# Patient Record
Sex: Female | Born: 2000 | Race: White | Hispanic: No | Marital: Single | Smoking: Former smoker
Health system: Southern US, Community
[De-identification: ages and names within clinical notes are randomized; demographics above are authoritative.]

## PROBLEM LIST (undated history)

## (undated) DIAGNOSIS — R011 Cardiac murmur, unspecified: Secondary | ICD-10-CM

## (undated) DIAGNOSIS — R569 Unspecified convulsions: Secondary | ICD-10-CM

## (undated) HISTORY — DX: Unspecified convulsions: R56.9

## (undated) HISTORY — PX: NO PAST SURGERIES: SHX2092

## (undated) HISTORY — DX: Cardiac murmur, unspecified: R01.1

---
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
## 2020-01-23 ENCOUNTER — Telehealth: Payer: Self-pay | Admitting: Adult Health

## 2020-01-23 NOTE — Telephone Encounter (Signed)
Unable to reach patient to review covid restrictions will screen patient upon check in at upcoming appointment on 01/24/20

## 2020-01-24 ENCOUNTER — Ambulatory Visit: Payer: Medicaid Other | Admitting: Adult Health

## 2020-02-22 ENCOUNTER — Ambulatory Visit: Payer: Medicaid Other | Admitting: Adult Health

## 2020-04-16 ENCOUNTER — Other Ambulatory Visit: Payer: Self-pay

## 2020-04-16 DIAGNOSIS — Z3A11 11 weeks gestation of pregnancy: Secondary | ICD-10-CM | POA: Insufficient documentation

## 2020-04-16 DIAGNOSIS — R109 Unspecified abdominal pain: Secondary | ICD-10-CM | POA: Diagnosis not present

## 2020-04-16 DIAGNOSIS — Z5321 Procedure and treatment not carried out due to patient leaving prior to being seen by health care provider: Secondary | ICD-10-CM | POA: Diagnosis not present

## 2020-04-16 DIAGNOSIS — O26891 Other specified pregnancy related conditions, first trimester: Secondary | ICD-10-CM | POA: Insufficient documentation

## 2020-04-17 ENCOUNTER — Emergency Department (HOSPITAL_COMMUNITY)
Admission: EM | Admit: 2020-04-17 | Discharge: 2020-04-17 | Disposition: A | Discharge status: Home / Self Care | Payer: Medicaid Other | Attending: Emergency Medicine | Admitting: Emergency Medicine

## 2020-04-17 ENCOUNTER — Encounter (HOSPITAL_COMMUNITY): Payer: Self-pay | Admitting: Emergency Medicine

## 2020-04-17 ENCOUNTER — Other Ambulatory Visit: Payer: Self-pay

## 2020-04-17 NOTE — ED Triage Notes (Signed)
Patient states she is [redacted] weeks pregnant and was being treated for an infection. Patient states that she was placed on a medicated gel/cream Metronidazole. Patient stated the medication yesterday. Patient states that she has been having abdominal cramping and passing possible white large pieces of discharge. Patient denies any bleeding.

## 2020-05-18 ENCOUNTER — Encounter (HOSPITAL_COMMUNITY): Payer: Self-pay

## 2020-05-18 ENCOUNTER — Emergency Department (HOSPITAL_COMMUNITY)
Admission: EM | Admit: 2020-05-18 | Discharge: 2020-05-18 | Disposition: A | Discharge status: Home / Self Care | Payer: Medicaid Other | Attending: Emergency Medicine | Admitting: Emergency Medicine

## 2020-05-18 ENCOUNTER — Other Ambulatory Visit: Payer: Self-pay

## 2020-05-18 DIAGNOSIS — O26892 Other specified pregnancy related conditions, second trimester: Secondary | ICD-10-CM | POA: Insufficient documentation

## 2020-05-18 DIAGNOSIS — Z5321 Procedure and treatment not carried out due to patient leaving prior to being seen by health care provider: Secondary | ICD-10-CM | POA: Diagnosis not present

## 2020-05-18 DIAGNOSIS — R21 Rash and other nonspecific skin eruption: Secondary | ICD-10-CM | POA: Diagnosis not present

## 2020-05-18 NOTE — ED Triage Notes (Signed)
Pt reports rash to inner thighs, denies pain or itching, says it has been there for about a week, pt recently diagnosed with BV and currently taking medication prescribed for that.

## 2020-06-04 ENCOUNTER — Other Ambulatory Visit: Payer: Self-pay

## 2020-06-04 ENCOUNTER — Emergency Department (HOSPITAL_COMMUNITY)
Admission: EM | Admit: 2020-06-04 | Discharge: 2020-06-04 | Disposition: A | Discharge status: Home / Self Care | Payer: Medicaid Other | Attending: Emergency Medicine | Admitting: Emergency Medicine

## 2020-06-04 ENCOUNTER — Encounter (HOSPITAL_COMMUNITY): Payer: Self-pay

## 2020-06-04 DIAGNOSIS — O219 Vomiting of pregnancy, unspecified: Secondary | ICD-10-CM | POA: Diagnosis not present

## 2020-06-04 DIAGNOSIS — Z5321 Procedure and treatment not carried out due to patient leaving prior to being seen by health care provider: Secondary | ICD-10-CM | POA: Insufficient documentation

## 2020-06-04 DIAGNOSIS — Z3A18 18 weeks gestation of pregnancy: Secondary | ICD-10-CM | POA: Insufficient documentation

## 2020-06-04 DIAGNOSIS — H9201 Otalgia, right ear: Secondary | ICD-10-CM | POA: Diagnosis present

## 2020-06-04 LAB — CBC
HCT: 36.8 % (ref 36.0–46.0)
Hemoglobin: 12.3 g/dL (ref 12.0–15.0)
MCH: 28.6 pg (ref 26.0–34.0)
MCHC: 33.4 g/dL (ref 30.0–36.0)
MCV: 85.6 fL (ref 80.0–100.0)
Platelets: 194 10*3/uL (ref 150–400)
RBC: 4.3 MIL/uL (ref 3.87–5.11)
RDW: 13.4 % (ref 11.5–15.5)
WBC: 7.7 10*3/uL (ref 4.0–10.5)
nRBC: 0 % (ref 0.0–0.2)

## 2020-06-04 LAB — COMPREHENSIVE METABOLIC PANEL
ALT: 11 U/L (ref 0–44)
AST: 11 U/L — ABNORMAL LOW (ref 15–41)
Albumin: 3.4 g/dL — ABNORMAL LOW (ref 3.5–5.0)
Alkaline Phosphatase: 39 U/L (ref 38–126)
Anion gap: 10 (ref 5–15)
BUN: 8 mg/dL (ref 6–20)
CO2: 20 mmol/L — ABNORMAL LOW (ref 22–32)
Calcium: 8.6 mg/dL — ABNORMAL LOW (ref 8.9–10.3)
Chloride: 104 mmol/L (ref 98–111)
Creatinine, Ser: 0.38 mg/dL — ABNORMAL LOW (ref 0.44–1.00)
GFR, Estimated: >60 mL/min (ref >60)
Glucose, Bld: 98 mg/dL (ref 70–99)
Potassium: 3.5 mmol/L (ref 3.5–5.1)
Sodium: 134 mmol/L — ABNORMAL LOW (ref 135–145)
Total Bilirubin: 1.1 mg/dL (ref 0.3–1.2)
Total Protein: 6.5 g/dL (ref 6.5–8.1)

## 2020-06-04 LAB — LIPASE, BLOOD: Lipase: 21 U/L (ref 11–51)

## 2020-06-04 NOTE — ED Triage Notes (Signed)
Pt presents to ED with complaints of right ear ache. Pt also states she is [redacted] weeks pregnant, [place] and had vomiting yesterday until this morning.

## 2020-08-21 ENCOUNTER — Emergency Department (HOSPITAL_COMMUNITY)
Admission: EM | Admit: 2020-08-21 | Discharge: 2020-08-21 | Discharge status: Against Medical Advice | Payer: Medicaid Other

## 2020-08-21 ENCOUNTER — Other Ambulatory Visit: Payer: Self-pay

## 2020-08-21 NOTE — ED Triage Notes (Signed)
Left prior to triage 

## 2020-09-29 ENCOUNTER — Emergency Department (HOSPITAL_COMMUNITY)
Admission: EM | Admit: 2020-09-29 | Discharge: 2020-09-30 | Disposition: A | Discharge status: Home / Self Care | Payer: Medicaid Other | Attending: Emergency Medicine | Admitting: Emergency Medicine

## 2020-09-29 ENCOUNTER — Encounter (HOSPITAL_COMMUNITY): Payer: Self-pay | Admitting: Emergency Medicine

## 2020-09-29 ENCOUNTER — Other Ambulatory Visit: Payer: Self-pay

## 2020-09-29 DIAGNOSIS — Z3A35 35 weeks gestation of pregnancy: Secondary | ICD-10-CM | POA: Insufficient documentation

## 2020-09-29 DIAGNOSIS — N939 Abnormal uterine and vaginal bleeding, unspecified: Secondary | ICD-10-CM

## 2020-09-29 DIAGNOSIS — Z87891 Personal history of nicotine dependence: Secondary | ICD-10-CM | POA: Diagnosis not present

## 2020-09-29 DIAGNOSIS — O26853 Spotting complicating pregnancy, third trimester: Secondary | ICD-10-CM | POA: Insufficient documentation

## 2020-09-29 LAB — WET PREP, GENITAL
Clue Cells Wet Prep HPF POC: NONE SEEN
Sperm: NONE SEEN
Trich, Wet Prep: NONE SEEN
Yeast Wet Prep HPF POC: NONE SEEN

## 2020-09-29 NOTE — ED Provider Notes (Signed)
[name] EMERGENCY DEPARTMENT Provider Note   CSN: [id number] Arrival date & time: 09/29/20  2154     History Chief Complaint  Patient presents with  . Hematuria    Ana Gordon is a 20 y.o. female.  Patient G2, P1 at [redacted] weeks gestation here with vaginal bleeding versus hematuria.  States earlier this afternoon she saw some bright red blood after wiping after urinating.  She is not certain if this was from her urine or from her vagina.  She states the urine itself is was clear.  She denies any gush of fluid or for further vaginal bleeding.  She denies any abdominal pain or contractions.  No back pain.  No cough, fever.  States no problems with this pregnancy.  She has no abdominal pain or contractions.  She is not had bleeding with this pregnancy before.  Denies any flank pain. Denies any recent sexual activity.  The history is provided by the patient.  Hematuria Pertinent negatives include no chest pain, no abdominal pain, no headaches and no shortness of breath.       Past Medical History:  Diagnosis Date  . Heart murmur     There are no problems to display for this patient.   Past Surgical History:  Procedure Laterality Date  . NO PAST SURGERIES       OB History    Gravida  2   Para  1   Term  1   Preterm      AB      Living  1     SAB      IAB      Ectopic      Multiple  0   Live Births  1           Family History  Problem Relation Age of Onset  . Diabetes Maternal Grandmother   . Hypertension Maternal Grandmother   . Alzheimer's disease Maternal Grandfather   . Hypertension Maternal Aunt   . Hypertension Maternal Uncle   . Hypertension Cousin     Social History   Tobacco Use  . Smoking status: Former Smoker    Types: Cigarettes, Cigars    Quit date: 05/18/2018    Years since quitting: 2.3  . Smokeless tobacco: Never Used  Vaping Use  . Vaping Use: Never used  Substance Use Topics  . Alcohol use: Not Currently     Comment: occ  . Drug use: Not Currently    Types: Marijuana    Comment: before pregnancy    Home Medications Prior to Admission medications   Medication Sig Start Date End Date Taking? Authorizing Provider  docusate sodium (COLACE) 100 MG capsule Take 1 capsule (100 mg total) by mouth 2 (two) times daily. Patient not taking: Reported on 03/01/2019 01/24/19   Arvilla Market, DO  LO LOESTRIN FE 1 MG-10 MCG / 10 MCG tablet Take 1 tablet by mouth daily. Patient not taking: Reported on 12/06/2019 03/01/19   Cheral Marker, CNM  Prenatal Vit-Fe Fumarate-FA (PREPLUS) 27-1 MG TABS TALE 1 TABLET DAILY AT 12 NOON. Patient not taking: Reported on 03/01/2019 10/21/18   Adline Potter, NP    Allergies    Bactrim [sulfamethoxazole-trimethoprim]  Review of Systems   Review of Systems  Constitutional: Negative for activity change, appetite change and fever.  HENT: Negative for congestion and rhinorrhea.   Respiratory: Negative for cough, chest tightness and shortness of breath.   Cardiovascular: Negative for chest pain and leg  swelling.  Gastrointestinal: Negative for abdominal pain, nausea and vomiting.  Genitourinary: Positive for hematuria and vaginal bleeding. Negative for dyspareunia.  Musculoskeletal: Negative for arthralgias and myalgias.  Neurological: Negative for dizziness, weakness and headaches.   all other systems are negative except as noted in the HPI and PMH.   Physical Exam Updated Vital Signs BP 127/70   Pulse 82   Temp 98.1 F (36.7 C)   Resp 18   Ht 5\' 4"  (1.626 m)   Wt 74.4 kg   LMP 01/18/2020 (Approximate)   SpO2 100%   BMI 28.15 kg/m   Physical Exam Vitals and nursing note reviewed.  Constitutional:      General: She is not in acute distress.    Appearance: She is well-developed and well-nourished.  HENT:     Head: Normocephalic and atraumatic.     Mouth/Throat:     Mouth: Oropharynx is clear and moist.     Pharynx: No oropharyngeal exudate.   Eyes:     Extraocular Movements: EOM normal.     Conjunctiva/sclera: Conjunctivae normal.     Pupils: Pupils are equal, round, and reactive to light.  Neck:     Comments: No meningismus. Cardiovascular:     Rate and Rhythm: Normal rate and regular rhythm.     Pulses: Intact distal pulses.     Heart sounds: Normal heart sounds. No murmur heard.   Pulmonary:     Effort: Pulmonary effort is normal. No respiratory distress.     Breath sounds: Normal breath sounds.  Abdominal:     Palpations: Abdomen is soft.     Tenderness: There is no abdominal tenderness. There is no guarding or rebound.     Comments: Gravid abdomen, nontender  Genitourinary:    Comments: Chaperone present.  Normal external genitalia.  No evidence of gross vaginal bleeding.  No blood apparent in vaginal vault on speculum exam.  Cervix is closed. Musculoskeletal:        General: No tenderness or edema. Normal range of motion.     Cervical back: Normal range of motion and neck supple.  Skin:    General: Skin is warm.  Neurological:     Mental Status: She is alert and oriented to person, place, and time.     Cranial Nerves: No cranial nerve deficit.     Motor: No abnormal muscle tone.     Coordination: Coordination normal.     Comments:  5/5 strength throughout. CN 2-12 intact.Equal grip strength.   Psychiatric:        Mood and Affect: Mood and affect normal.        Behavior: Behavior normal.     ED Results / Procedures / Treatments   Labs (all labs ordered are listed, but only abnormal results are displayed) Labs Reviewed  WET PREP, GENITAL - Abnormal; Notable for the following components:      Result Value   WBC, Wet Prep HPF POC FEW (*)    All other components within normal limits  URINALYSIS, ROUTINE W REFLEX MICROSCOPIC - Abnormal; Notable for the following components:   APPearance HAZY (*)    Leukocytes,Ua TRACE (*)    Bacteria, UA RARE (*)    All other components within normal limits  URINE  CULTURE  GC/CHLAMYDIA PROBE AMP ([place]) NOT AT [facility]    EKG None  Radiology No results found.  Procedures Procedures   Medications Ordered in ED Medications - No data to display  ED Course  I have  reviewed the triage vital signs and the nursing notes.  Pertinent labs & imaging results that were available during my care of the patient were reviewed by me and considered in my medical decision making (see chart for details).    MDM Rules/Calculators/A&P                         G2, P1 at [redacted] weeks gestation here with vaginal bleeding after urination.  Denies any pain or gush of fluid.  No evidence of recent vaginal bleeding on exam.  Discussed with Dr. Donavan Foil of obstetrics.  He feels this could be spotting or pink discharge and would not recommend any further evaluation tonight.  Agrees with fetal monitoring.  UA is negative for blood.  Does have white blood cells.  Will send culture.  Patient monitored on tocometry.  She has been cleared by obstetrics. D/w Dr. Donzetta Matters covering for Dr. Ralph Dowdy.  She agrees patient stable for discharge.  Urine culture is pending.  Unclear etiology of vaginal spotting but no evidence of blood in the urine and no evidence of blood in her vaginal vault today.  No evidence of contractions on monitoring.  Patient to follow-up with her obstetric team on Monday.  Return to the ED (at [facility]) with worsening symptoms including abdominal pain, vaginal bleeding, contractions, gush of fluid or aany other concerns. Final Clinical Impression(s) / ED Diagnoses Final diagnoses:  Vaginal spotting    Rx / DC Orders ED Discharge Orders    None       Junior Kenedy, Jeannett Senior, MD 09/30/20 0100

## 2020-09-29 NOTE — ED Triage Notes (Signed)
Pt states that when she wipes after urinating there is bright red blood on the tissue. Pt is [redacted] weeks pregnant

## 2020-09-30 LAB — URINALYSIS, ROUTINE W REFLEX MICROSCOPIC
Bilirubin Urine: NEGATIVE
Glucose, UA: NEGATIVE mg/dL
Hgb urine dipstick: NEGATIVE
Ketones, ur: NEGATIVE mg/dL
Nitrite: NEGATIVE
Protein, ur: NEGATIVE mg/dL
Specific Gravity, Urine: 1.013 (ref 1.005–1.030)
pH: 6 (ref 5.0–8.0)

## 2020-09-30 NOTE — Discharge Instructions (Signed)
There is no evidence of any blood in your vagina or urine sample today.  You should follow-up with Dr. Ralph Dowdy on Monday.  Go to [facility] ED if you develop worsening pain, gush of fluid, vaginal bleeding or any other concerns.

## 2020-10-01 LAB — GC/CHLAMYDIA PROBE AMP (~~LOC~~) NOT AT ARMC
Chlamydia: NEGATIVE
Comment: NEGATIVE
Comment: NORMAL
Neisseria Gonorrhea: NEGATIVE

## 2020-10-01 LAB — URINE CULTURE: Culture: NO GROWTH

## 2020-10-08 ENCOUNTER — Ambulatory Visit: Payer: Medicaid Other

## 2020-10-08 ENCOUNTER — Other Ambulatory Visit: Payer: Self-pay

## 2021-01-12 ENCOUNTER — Other Ambulatory Visit: Payer: Self-pay

## 2021-01-12 ENCOUNTER — Emergency Department (HOSPITAL_COMMUNITY)
Admission: EM | Admit: 2021-01-12 | Discharge: 2021-01-12 | Disposition: A | Discharge status: Home / Self Care | Payer: Medicaid Other | Attending: Emergency Medicine | Admitting: Emergency Medicine

## 2021-01-12 ENCOUNTER — Encounter (HOSPITAL_COMMUNITY): Payer: Self-pay | Admitting: *Deleted

## 2021-01-12 DIAGNOSIS — Z87891 Personal history of nicotine dependence: Secondary | ICD-10-CM | POA: Diagnosis not present

## 2021-01-12 DIAGNOSIS — N6002 Solitary cyst of left breast: Secondary | ICD-10-CM | POA: Insufficient documentation

## 2021-01-12 MED ORDER — CEPHALEXIN 500 MG PO CAPS: 500.0000 mg | ORAL_CAPSULE | Freq: Three times a day (TID) | ORAL | 0 refills | Status: DC

## 2021-01-12 NOTE — Discharge Instructions (Signed)
Follow-up with your gynecologist within the next 7 days.  Call to make the next available appointment.  Take cephalexin 3 times a day for the next 7 days.  Warm compresses, return to the emergency department for severe worsening swelling redness pain or fever

## 2021-01-12 NOTE — ED Triage Notes (Signed)
Pt with redness and lump to left breast, stopped breast feeding about 2 months ago.  Denies fever or N/V

## 2021-01-12 NOTE — ED Provider Notes (Signed)
[facility] EMERGENCY DEPARTMENT Provider Note   CSN: [id number] Arrival date & time: 01/12/21  1620     History Chief Complaint  Patient presents with  . Breast Problem    Ana Gordon is a 20 y.o. female.  HPI   Patient is a 20 year old female, she has no chronic medical problems, she delivered a child approximately 2 months ago.  She does remember breast-feeding at that time.  She did fine until about 2 weeks ago when she started to develop a lump on the left breast just above the nipple.  This has been progressive in size and tenderness, there is no drainage, no surrounding redness, no fevers or chills, no other systemic symptoms.  She is tried warm compresses and tried to mash on it but there has been no drainage.  She has not been seen by her gynecologist for the same complaint.  Past Medical History:  Diagnosis Date  . Heart murmur     There are no problems to display for this patient.   Past Surgical History:  Procedure Laterality Date  . NO PAST SURGERIES       OB History    Gravida  2   Para  1   Term  1   Preterm      AB      Living  1     SAB      IAB      Ectopic      Multiple  0   Live Births  1           Family History  Problem Relation Age of Onset  . Diabetes Maternal Grandmother   . Hypertension Maternal Grandmother   . Alzheimer's disease Maternal Grandfather   . Hypertension Maternal Aunt   . Hypertension Maternal Uncle   . Hypertension Cousin     Social History   Tobacco Use  . Smoking status: Former Smoker    Types: Cigarettes, Cigars    Quit date: 05/18/2018    Years since quitting: 2.6  . Smokeless tobacco: Never Used  Vaping Use  . Vaping Use: Never used  Substance Use Topics  . Alcohol use: Not Currently    Comment: occ  . Drug use: Not Currently    Types: Marijuana    Comment: before pregnancy    Home Medications Prior to Admission medications   Medication Sig Start Date End Date Taking?  Authorizing Provider  cephALEXin (KEFLEX) 500 MG capsule Take 1 capsule (500 mg total) by mouth 3 (three) times daily for 7 days. 01/12/21 01/19/21 Yes Eber Hong, MD  LO LOESTRIN FE 1 MG-10 MCG / 10 MCG tablet Take 1 tablet by mouth daily. Patient not taking: Reported on 12/06/2019 03/01/19 01/12/21  Cheral Marker, CNM    Allergies    Bactrim [sulfamethoxazole-trimethoprim]  Review of Systems   Review of Systems  Constitutional: Negative for fever.  Skin: Positive for rash.    Physical Exam Updated Vital Signs BP 126/89 (BP Location: Left Arm)   Pulse 83   Temp 98.2 F (36.8 C) (Oral)   Resp 18   Ht 1.575 m (5\' 2" )   Wt 63.5 kg   LMP 12/17/2019   SpO2 100%   Breastfeeding Unknown   BMI 25.61 kg/m   Physical Exam Vitals and nursing note reviewed.  Constitutional:      Appearance: She is well-developed. She is not diaphoretic.  HENT:     Head: Normocephalic and atraumatic.  Eyes:  General:        Right eye: No discharge.        Left eye: No discharge.     Conjunctiva/sclera: Conjunctivae normal.  Cardiovascular:     Rate and Rhythm: Normal rate and regular rhythm.  Pulmonary:     Effort: Pulmonary effort is normal. No respiratory distress.  Skin:    General: Skin is warm and dry.     Findings: No erythema or rash.     Comments: Chaperone present for the breast exam.  The patient has symmetrical looking breasts, the left-sided nipple has some tenderness just at the 12 o'clock position above the nipple.  There is a small cystic lump at this area that is approximately half a centimeter in diameter.  There is no surrounding redness, there is minimal tenderness, there is no purulence or fluctuance.  There is no lymphadenopathy around the breast tissue and no other lumps in the breast tissue at all.  No LAD int eh axilla  Neurological:     Mental Status: She is alert.     Coordination: Coordination normal.     Comments: Normal speech / gait and coordination   Psychiatric:        Mood and Affect: Mood normal.     ED Results / Procedures / Treatments   Labs (all labs ordered are listed, but only abnormal results are displayed) Labs Reviewed - No data to display  EKG None  Radiology No results found.  Procedures Procedures   Medications Ordered in ED Medications - No data to display  ED Course  I have reviewed the triage vital signs and the nursing notes.  Pertinent labs & imaging results that were available during my care of the patient were reviewed by me and considered in my medical decision making (see chart for details).    MDM Rules/Calculators/A&P                          Pt has small area that Is cystic, will place the patient on cephalexin and have her follow-up with gynecology in approximately 1 week.  The patient is agreeable to the plan, she does not have any surrounding cellulitis, she does not have any systemic symptoms to suggest that this is mastitis, could be a single clogged duct or cyst, can follow-up outpatient.  Patient agreeable  Final Clinical Impression(s) / ED Diagnoses Final diagnoses:  Cyst of left nipple    Rx / DC Orders ED Discharge Orders         Ordered    cephALEXin (KEFLEX) 500 MG capsule  3 times daily        01/12/21 1801           Eber Hong, MD 01/12/21 1801

## 2021-01-13 ENCOUNTER — Encounter (HOSPITAL_COMMUNITY): Payer: Self-pay | Admitting: Emergency Medicine

## 2021-01-13 ENCOUNTER — Other Ambulatory Visit: Payer: Self-pay

## 2021-01-13 ENCOUNTER — Emergency Department (HOSPITAL_COMMUNITY)
Admission: EM | Admit: 2021-01-13 | Discharge: 2021-01-13 | Disposition: A | Discharge status: Home / Self Care | Payer: Medicaid Other | Attending: Emergency Medicine | Admitting: Emergency Medicine

## 2021-01-13 DIAGNOSIS — L0291 Cutaneous abscess, unspecified: Secondary | ICD-10-CM

## 2021-01-13 DIAGNOSIS — Z87891 Personal history of nicotine dependence: Secondary | ICD-10-CM | POA: Insufficient documentation

## 2021-01-13 DIAGNOSIS — N611 Abscess of the breast and nipple: Secondary | ICD-10-CM | POA: Insufficient documentation

## 2021-01-13 MED ORDER — CEPHALEXIN 500 MG PO CAPS: 500.0000 mg | ORAL_CAPSULE | Freq: Three times a day (TID) | ORAL | 0 refills | Status: AC

## 2021-01-13 NOTE — Discharge Instructions (Addendum)
You were seen today for evaluation of an abscess/cyst. The site is clear of infection and will heal on its own with time. Please pick up antibiotics and take it 3x daily for the next 7 days to assure the entire infection is irradiated. You may wash the site with soap and water. No need to pack it. Have you OBGYN take a second look at the site when you see her next week.

## 2021-01-13 NOTE — ED Provider Notes (Signed)
[facility] EMERGENCY DEPARTMENT Provider Note   CSN: [id number] Arrival date & time: 01/13/21  2009     History Chief Complaint  Patient presents with  . Abscess    Ana Gordon is a 20 y.o. female.  HPI   Patient presents for abscess to left breast. She was seen in the ED yesterday for same complaint. States today she applied a hot compress and the abscess burst, white pus drained as well and sanguinous fluid. She is concerned that there is now an empty cyst over her breast. She has not yet picked her antibiotics. No fever, chills, vomiting, nausea, surrounding tenderness, or systemic symptoms. She sees her OBGYN next week.   Past Medical History:  Diagnosis Date  . Heart murmur     There are no problems to display for this patient.   Past Surgical History:  Procedure Laterality Date  . NO PAST SURGERIES       OB History    Gravida  2   Para  1   Term  1   Preterm      AB      Living  1     SAB      IAB      Ectopic      Multiple  0   Live Births  1           Family History  Problem Relation Age of Onset  . Diabetes Maternal Grandmother   . Hypertension Maternal Grandmother   . Alzheimer's disease Maternal Grandfather   . Hypertension Maternal Aunt   . Hypertension Maternal Uncle   . Hypertension Cousin     Social History   Tobacco Use  . Smoking status: Former Smoker    Types: Cigarettes, Cigars    Quit date: 05/18/2018    Years since quitting: 2.6  . Smokeless tobacco: Never Used  Vaping Use  . Vaping Use: Never used  Substance Use Topics  . Alcohol use: Not Currently    Comment: occ  . Drug use: Not Currently    Types: Marijuana    Comment: before pregnancy    Home Medications Prior to Admission medications   Medication Sig Start Date End Date Taking? Authorizing Provider  cephALEXin (KEFLEX) 500 MG capsule Take 1 capsule (500 mg total) by mouth 3 (three) times daily for 7 days. 01/13/21 01/20/21  Theron Arista, PA-C  LO  LOESTRIN FE 1 MG-10 MCG / 10 MCG tablet Take 1 tablet by mouth daily. Patient not taking: Reported on 12/06/2019 03/01/19 01/12/21  Cheral Marker, CNM    Allergies    Bactrim [sulfamethoxazole-trimethoprim]  Review of Systems   Review of Systems  Constitutional: Negative for chills and fever.  Gastrointestinal: Negative for nausea and vomiting.  Skin: Positive for wound.       Abscess/cyst to left breast    Physical Exam Updated Vital Signs BP 122/78   Pulse 80   Temp 98 F (36.7 C) (Oral)   Resp 16   Ht 5\' 2"  (1.575 m)   Wt 63.5 kg   LMP 01/18/2020 (Approximate)   SpO2 100%   BMI 25.61 kg/m   Physical Exam Vitals and nursing note reviewed. Exam conducted with a chaperone present.  Constitutional:      General: She is not in acute distress.    Appearance: Normal appearance.     Comments: Female chaperone present.   HENT:     Head: Normocephalic and atraumatic.  Eyes:  General: No scleral icterus.    Extraocular Movements: Extraocular movements intact.     Pupils: Pupils are equal, round, and reactive to light.  Skin:    Coloration: Skin is not jaundiced.     Comments: Empty cyst to 12'o clock area above areola on left breast. No surrounding erythema, no fluctuance, no drainage, not warm to touch. The cyst is empty. Unable to express pus with gentle pressure.   Neurological:     Mental Status: She is alert. Mental status is at baseline.     Coordination: Coordination normal.     ED Results / Procedures / Treatments   Labs (all labs ordered are listed, but only abnormal results are displayed) Labs Reviewed - No data to display  EKG None  Radiology No results found.  Procedures Procedures   Medications Ordered in ED Medications - No data to display  ED Course  I have reviewed the triage vital signs and the nursing notes.  Pertinent labs & imaging results that were available during my care of the patient were reviewed by me and considered in my  medical decision making (see chart for details).    MDM Rules/Calculators/A&P                          Patient presents with concerns for abscess. Vitals stable, nontoxic appearing. No systemic systems. No signs of cellulitis, mastitis, mastoiditis.   Patient advised to take her antibiotics as prescribed. Continue warm compresses and clean the site with soap and water. Have her OBG re-evaluate at her appointment on Monday. Return precautions given. Patient agreeable to plan.   Final Clinical Impression(s) / ED Diagnoses Final diagnoses:  Abscess    Rx / DC Orders ED Discharge Orders         Ordered    cephALEXin (KEFLEX) 500 MG capsule  3 times daily        01/13/21 2043           Theron Arista, Cordelia Poche 01/13/21 2054    Eber Hong, MD 01/18/21 [phone]

## 2021-01-13 NOTE — ED Triage Notes (Signed)
Pt seen yesterday for abscess to left breast. Today abscess opened and began to drain.

## 2021-02-19 ENCOUNTER — Encounter (HOSPITAL_COMMUNITY): Payer: Self-pay | Admitting: *Deleted

## 2021-02-19 ENCOUNTER — Other Ambulatory Visit: Payer: Self-pay

## 2021-02-19 ENCOUNTER — Emergency Department (HOSPITAL_COMMUNITY)
Admission: EM | Admit: 2021-02-19 | Discharge: 2021-02-19 | Discharge status: Against Medical Advice | Payer: Medicaid Other | Attending: Emergency Medicine | Admitting: Emergency Medicine

## 2021-02-19 DIAGNOSIS — O2390 Unspecified genitourinary tract infection in pregnancy, unspecified trimester: Secondary | ICD-10-CM | POA: Insufficient documentation

## 2021-02-19 DIAGNOSIS — R110 Nausea: Secondary | ICD-10-CM | POA: Insufficient documentation

## 2021-02-19 DIAGNOSIS — Z87891 Personal history of nicotine dependence: Secondary | ICD-10-CM | POA: Insufficient documentation

## 2021-02-19 DIAGNOSIS — Z3A00 Weeks of gestation of pregnancy not specified: Secondary | ICD-10-CM | POA: Insufficient documentation

## 2021-02-19 DIAGNOSIS — R1030 Lower abdominal pain, unspecified: Secondary | ICD-10-CM | POA: Insufficient documentation

## 2021-02-19 DIAGNOSIS — Z3201 Encounter for pregnancy test, result positive: Secondary | ICD-10-CM

## 2021-02-19 DIAGNOSIS — R8271 Bacteriuria: Secondary | ICD-10-CM

## 2021-02-19 LAB — URINALYSIS, ROUTINE W REFLEX MICROSCOPIC
Bilirubin Urine: NEGATIVE
Glucose, UA: NEGATIVE mg/dL
Ketones, ur: NEGATIVE mg/dL
Nitrite: POSITIVE — AB
Protein, ur: 30 mg/dL — AB
Specific Gravity, Urine: 1.028 (ref 1.005–1.030)
pH: 5 (ref 5.0–8.0)

## 2021-02-19 MED ORDER — CEPHALEXIN 500 MG PO CAPS: 500.0000 mg | ORAL_CAPSULE | Freq: Three times a day (TID) | ORAL | 0 refills | Status: DC

## 2021-02-19 NOTE — ED Provider Notes (Signed)
[facility] EMERGENCY DEPARTMENT Provider Note   CSN: [id number] Arrival date & time: 02/19/21  1256     History Chief Complaint  Patient presents with   Abdominal Pain    Ana Gordon is a 20 y.o. female who presents to the ED requesting a pregnancy test. Patient states her LMP was at the end of May, she has had lower abdominal cramping intermittently for a couple of weeks similar to what she had when she was pregnant previously. Has had some nausea. No alleviating/aggravating factors. Denies fever, chills, dysuria, urgency, frequency, or vaginal bleeding/discharge.   HPI     Past Medical History:  Diagnosis Date   Heart murmur     There are no problems to display for this patient.   Past Surgical History:  Procedure Laterality Date   NO PAST SURGERIES       OB History     Gravida  2   Para  1   Term  1   Preterm      AB      Living  1      SAB      IAB      Ectopic      Multiple  0   Live Births  1           Family History  Problem Relation Age of Onset   Diabetes Maternal Grandmother    Hypertension Maternal Grandmother    Alzheimer's disease Maternal Grandfather    Hypertension Maternal Aunt    Hypertension Maternal Uncle    Hypertension Cousin     Social History   Tobacco Use   Smoking status: Former    Pack years: 0.00    Types: Cigarettes, Cigars    Quit date: 05/18/2018    Years since quitting: 2.7   Smokeless tobacco: Never  Vaping Use   Vaping Use: Never used  Substance Use Topics   Alcohol use: Not Currently    Comment: occ   Drug use: Not Currently    Types: Marijuana    Comment: before pregnancy    Home Medications Prior to Admission medications   Medication Sig Start Date End Date Taking? Authorizing Provider  LO LOESTRIN FE 1 MG-10 MCG / 10 MCG tablet Take 1 tablet by mouth daily. Patient not taking: Reported on 12/06/2019 03/01/19 01/12/21  Cheral Marker, CNM    Allergies    Bactrim  [sulfamethoxazole-trimethoprim]  Review of Systems   Review of Systems  Constitutional:  Negative for chills and fever.  Respiratory:  Negative for shortness of breath.   Cardiovascular:  Negative for chest pain.  Gastrointestinal:  Positive for abdominal pain and nausea. Negative for blood in stool, diarrhea and vomiting.  Genitourinary:  Negative for dysuria, frequency, hematuria, vaginal bleeding and vaginal discharge.  Neurological:  Negative for syncope.  All other systems reviewed and are negative.  Physical Exam Updated Vital Signs BP (!) 117/59   Pulse 72   Temp 98.5 F (36.9 C) (Oral)   Resp 18   SpO2 97%   Physical Exam Vitals and nursing note reviewed.  Constitutional:      General: She is not in acute distress.    Appearance: She is well-developed. She is not toxic-appearing.  HENT:     Head: Normocephalic and atraumatic.  Eyes:     General:        Right eye: No discharge.        Left eye: No discharge.     Conjunctiva/sclera:  Conjunctivae normal.  Cardiovascular:     Rate and Rhythm: Normal rate and regular rhythm.  Pulmonary:     Effort: Pulmonary effort is normal. No respiratory distress.     Breath sounds: Normal breath sounds. No wheezing, rhonchi or rales.  Abdominal:     General: There is no distension.     Palpations: Abdomen is soft.     Tenderness: There is no abdominal tenderness. There is no guarding or rebound.  Genitourinary:    Comments: Patient refused.  Musculoskeletal:     Cervical back: Neck supple.  Skin:    General: Skin is warm and dry.     Findings: No rash.  Neurological:     Mental Status: She is alert.     Comments: Clear speech.   Psychiatric:        Behavior: Behavior normal.    ED Results / Procedures / Treatments   Labs (all labs ordered are listed, but only abnormal results are displayed) Labs Reviewed  COMPREHENSIVE METABOLIC PANEL  LIPASE, BLOOD  CBC WITH DIFFERENTIAL/PLATELET  URINALYSIS, ROUTINE W REFLEX  MICROSCOPIC  POC URINE PREG, ED    EKG None  Radiology No results found.  Procedures Procedures   Medications Ordered in ED Medications - No data to display  ED Course  I have reviewed the triage vital signs and the nursing notes.  Pertinent labs & imaging results that were available during my care of the patient were reviewed by me and considered in my medical decision making (see chart for details).    MDM Rules/Calculators/A&P                         Patient presents to the ED with complaints of abdominal cramping requesting a pregnancy test. Nontoxic, vitals without significant abnormality.    Additional history obtained:  Additional history obtained from chart review & nursing note review.   Lab Tests:  I reviewed & interpreted labs, which included:  Pregnancy test: positive.   ED Course:  Patient with a positive pregnancy test, given her intermittent abdominal cramping I recommended she remain in the emergency department for a pelvic exam and an ultrasound to rule out ectopic pregnancy.  Patient refused, she states she would like to go home, she does not wish to have either of these performed in the ER today. Patient signing out AGAINST MEDICAL ADVICE- The patient and I discussed the nature and purpose, risks and benefits, as well as, the alternatives of treatment. Time was given to allow the opportunity to ask questions and consider options. After the discussion, the patient decided to refuse further evaluation with pelvic exam/labs/US. The patient was informed that refusal could lead to, but was not limited to, death, permanent disability, or severe pain. Prior to refusing, I determined that the patient had the capacity to make their decision and understood the consequences of that decision. After refusal, I made every reasonable effort to treat them to the best of my ability.  The patient was notified that they may return to the emergency department at any time for further  treatment. UA with many bacteria, nitrite/leukocyte positive, contamination noted, she denies any urinary sxs, will tx with keflex given she is pregnant with urine sent for culture. Advised she may return to the ED at anytime, discussed strict return precautions, and need for OBGYN follow up which she states she has scheduled for 02/27/21.   Portions of this note were generated with Scientist, clinical (histocompatibility and immunogenetics).  Dictation errors may occur despite best attempts at proofreading.  Final Clinical Impression(s) / ED Diagnoses Final diagnoses:  Positive pregnancy test  Bacteriuria during pregnancy    Rx / DC Orders ED Discharge Orders          Ordered    cephALEXin (KEFLEX) 500 MG capsule  3 times daily        02/19/21 1341             Treyton Slimp, Alta R, PA-C 02/19/21 1357    Derwood Kaplan, MD 02/20/21 [phone]

## 2021-02-19 NOTE — ED Notes (Signed)
Pt refuses labs due to fear of needles.

## 2021-02-19 NOTE — Discharge Instructions (Addendum)
You were seen in the emergency department today for a pregnancy test this was positive.  We recommended you stay for ultrasound and further evaluation which you refused.  You may return to the emergency department anytime for further evaluation.  We are sending home with Keflex, an antibiotic, to treat for bacteria in your urine.  Please take this as prescribed.  We have prescribed you new medication(s) today. Discuss the medications prescribed today with your pharmacist as they can have adverse effects and interactions with your other medicines including over the counter and prescribed medications. Seek medical evaluation if you start to experience new or abnormal symptoms after taking one of these medicines, seek care immediately if you start to experience difficulty breathing, feeling of your throat closing, facial swelling, or rash as these could be indications of a more serious allergic reaction  Please discuss any medication to take with the pharmacist to ensure that they are safe in pregnancy.  Please follow-up with your OB/GYN as scheduled.  Again return to the ER at any time.  Return to the ER for new or worsening symptoms including but not limited to new or worsening pain, dizziness, lightheadedness, passing out, fever, inability to keep fluids down, or any other concerns.

## 2021-02-19 NOTE — ED Triage Notes (Signed)
Abdominal cramping, concerned she may be pregnant.

## 2021-02-19 NOTE — ED Notes (Signed)
Pt refused d/c vitals.

## 2021-02-20 LAB — POC URINE PREG, ED: Preg Test, Ur: POSITIVE — AB

## 2021-02-23 LAB — URINE CULTURE: Culture: >=100000 — AB

## 2021-02-27 ENCOUNTER — Other Ambulatory Visit: Payer: Self-pay

## 2021-02-27 ENCOUNTER — Encounter (HOSPITAL_COMMUNITY): Payer: Self-pay | Admitting: *Deleted

## 2021-02-27 ENCOUNTER — Emergency Department (HOSPITAL_COMMUNITY)
Admission: EM | Admit: 2021-02-27 | Discharge: 2021-02-27 | Disposition: A | Discharge status: Home / Self Care | Payer: Medicaid Other | Attending: Emergency Medicine | Admitting: Emergency Medicine

## 2021-02-27 DIAGNOSIS — Z20822 Contact with and (suspected) exposure to covid-19: Secondary | ICD-10-CM

## 2021-02-27 DIAGNOSIS — Z3202 Encounter for pregnancy test, result negative: Secondary | ICD-10-CM | POA: Diagnosis present

## 2021-02-27 DIAGNOSIS — R059 Cough, unspecified: Secondary | ICD-10-CM | POA: Insufficient documentation

## 2021-02-27 DIAGNOSIS — R509 Fever, unspecified: Secondary | ICD-10-CM | POA: Diagnosis not present

## 2021-02-27 DIAGNOSIS — Z87440 Personal history of urinary (tract) infections: Secondary | ICD-10-CM | POA: Insufficient documentation

## 2021-02-27 DIAGNOSIS — Z87891 Personal history of nicotine dependence: Secondary | ICD-10-CM | POA: Insufficient documentation

## 2021-02-27 LAB — POC URINE PREG, ED: Preg Test, Ur: NEGATIVE

## 2021-02-27 LAB — SARS CORONAVIRUS 2 (TAT 6-24 HRS): SARS Coronavirus 2: NEGATIVE

## 2021-02-27 NOTE — ED Triage Notes (Signed)
Requesting verification of pregnancy and covid testing

## 2021-02-27 NOTE — ED Notes (Signed)
ED Provider at bedside. 

## 2021-02-27 NOTE — Discharge Instructions (Addendum)
Your pregnancy test is negative today and I suspect you may have a had a spontaneous miscarriage early in this pregnancy. Since you are symptom free at this time, you do not need any further testing here, but I do recommend a close follow up with your ob/gyn for any new symptoms.    Your Covid test is pending at this time - check your results in Mychart.  In the interim, I recommend you maintain home quarantine until your test is resulted (and negative) - this should be back by tomorrow.  If positive you will need to maintain home quarantine for 7 days, longer if symptoms persist.

## 2021-02-27 NOTE — ED Provider Notes (Signed)
[facility] EMERGENCY DEPARTMENT Provider Note   CSN: [id number] Arrival date & time: 02/27/21  1157     History Chief Complaint  Patient presents with   Possible Pregnancy    Ana Gordon is a 20 y.o. female presenting requesting confirmation of her pregnancy.  She was seen here on July 5 at which time she had complaints of lower abdominal cramping and had taken a home pregnancy test which was positive.  Her pregnancy test when here that day confirmed her pregnancy.  Her LMP was the end of May.  She is currently 4 months postpartum.  During that ED visit she was offered an ultrasound given her symptoms to rule out ectopic pregnancy, however she deferred this test.  She returns today because she had some mild vaginal spotting starting on July 6, it got heavier and has almost resolved as of today.  She denies any further cramping or any discomfort, but took a home pregnancy test which is now negative.  She was diagnosed with a UTI on the sixth and has completed a course of Keflex.  She denies having any urinary complaints at this time.  She did have a low-grade fever of 100.8 yesterday and has had a nonproductive cough and is also concerned about COVID screening.  She denies shortness of breath, chest pain, weakness or other complaints.  She is unsure about any possible COVID contacts.  The history is provided by the patient.      Past Medical History:  Diagnosis Date   Heart murmur     There are no problems to display for this patient.   Past Surgical History:  Procedure Laterality Date   NO PAST SURGERIES       OB History     Gravida  2   Para  1   Term  1   Preterm      AB      Living  1      SAB      IAB      Ectopic      Multiple  0   Live Births  1           Family History  Problem Relation Age of Onset   Diabetes Maternal Grandmother    Hypertension Maternal Grandmother    Alzheimer's disease Maternal Grandfather    Hypertension  Maternal Aunt    Hypertension Maternal Uncle    Hypertension Cousin     Social History   Tobacco Use   Smoking status: Former    Pack years: 0.00    Types: Cigarettes, Cigars    Quit date: 05/18/2018    Years since quitting: 2.7   Smokeless tobacco: Never  Vaping Use   Vaping Use: Never used  Substance Use Topics   Alcohol use: Not Currently    Comment: occ   Drug use: Not Currently    Types: Marijuana    Comment: before pregnancy    Home Medications Prior to Admission medications   Medication Sig Start Date End Date Taking? Authorizing Provider  cephALEXin (KEFLEX) 500 MG capsule Take 1 capsule (500 mg total) by mouth 3 (three) times daily. 02/19/21   Petrucelli, Samantha R, PA-C  LO LOESTRIN FE 1 MG-10 MCG / 10 MCG tablet Take 1 tablet by mouth daily. Patient not taking: Reported on 12/06/2019 03/01/19 01/12/21  Cheral Marker, CNM    Allergies    Bactrim [sulfamethoxazole-trimethoprim]  Review of Systems   Review of Systems  Constitutional:  Positive for fever.  HENT:  Negative for congestion and sore throat.   Eyes: Negative.   Respiratory:  Positive for cough. Negative for chest tightness and shortness of breath.   Cardiovascular:  Negative for chest pain.  Gastrointestinal:  Negative for abdominal pain, nausea and vomiting.  Genitourinary:  Negative for dysuria, flank pain, pelvic pain, vaginal discharge and vaginal pain.  Musculoskeletal:  Negative for arthralgias, joint swelling and neck pain.  Skin: Negative.  Negative for rash and wound.  Neurological:  Negative for dizziness, weakness, light-headedness, numbness and headaches.  Psychiatric/Behavioral: Negative.     Physical Exam Updated Vital Signs BP 102/71   Pulse 79   Temp 97.8 F (36.6 C) (Oral)   Resp 20   SpO2 97%   Physical Exam Vitals and nursing note reviewed.  Constitutional:      Appearance: She is well-developed.  HENT:     Head: Normocephalic and atraumatic.  Eyes:      Conjunctiva/sclera: Conjunctivae normal.  Cardiovascular:     Rate and Rhythm: Normal rate and regular rhythm.     Heart sounds: Normal heart sounds.  Pulmonary:     Effort: Pulmonary effort is normal.     Breath sounds: Normal breath sounds. No wheezing or rhonchi.  Abdominal:     General: Bowel sounds are normal.     Palpations: Abdomen is soft.     Tenderness: There is no abdominal tenderness. There is no guarding or rebound.  Musculoskeletal:        General: Normal range of motion.     Cervical back: Normal range of motion.  Skin:    General: Skin is warm and dry.  Neurological:     General: No focal deficit present.     Mental Status: She is alert and oriented to person, place, and time.    ED Results / Procedures / Treatments   Labs (all labs ordered are listed, but only abnormal results are displayed) Labs Reviewed  SARS CORONAVIRUS 2 (TAT 6-24 HRS)  POC URINE PREG, ED    EKG None  Radiology No results found.  Procedures Procedures   Medications Ordered in ED Medications - No data to display  ED Course  I have reviewed the triage vital signs and the nursing notes.  Pertinent labs & imaging results that were available during my care of the patient were reviewed by me and considered in my medical decision making (see chart for details).    MDM Rules/Calculators/A&P                          Pregnancy test is negative today and pt is asymptomatic except for trace spotting.  No indication for imaging - abd soft, nontender, no guarding or distention.  Probable miscarriage early in pregnancy.  Chart documented A positive blood type - no rhogam indicated. Covid screening pending - pt with no respiratory compromise, appears well, VSS.  Home instructions discussed in event she is positive. Return precautions outlined.  Ana Gordon was evaluated in Emergency Department on 02/27/2021 for the symptoms described in the history of present illness. She was evaluated in  the context of the global COVID-19 pandemic, which necessitated consideration that the patient might be at risk for infection with the SARS-CoV-2 virus that causes COVID-19. Institutional protocols and algorithms that pertain to the evaluation of patients at risk for COVID-19 are in a state of rapid change based on information released by regulatory bodies including the CDC  and federal and state organizations. These policies and algorithms were followed during the patient's care in the ED.  Final Clinical Impression(s) / ED Diagnoses Final diagnoses:  Negative pregnancy test  Encounter for laboratory testing for COVID-19 virus    Rx / DC Orders ED Discharge Orders     None        Victoriano Lain 02/27/21 1343    Cathren Laine, MD 03/02/21 1540

## 2021-04-09 ENCOUNTER — Other Ambulatory Visit: Payer: Self-pay

## 2021-04-09 ENCOUNTER — Emergency Department (HOSPITAL_COMMUNITY)
Admission: EM | Admit: 2021-04-09 | Discharge: 2021-04-09 | Disposition: A | Discharge status: Home / Self Care | Payer: Medicaid Other | Attending: Emergency Medicine | Admitting: Emergency Medicine

## 2021-04-09 ENCOUNTER — Encounter (HOSPITAL_COMMUNITY): Payer: Self-pay

## 2021-04-09 ENCOUNTER — Emergency Department (HOSPITAL_COMMUNITY): Payer: Medicaid Other

## 2021-04-09 DIAGNOSIS — W108XXA Fall (on) (from) other stairs and steps, initial encounter: Secondary | ICD-10-CM | POA: Insufficient documentation

## 2021-04-09 DIAGNOSIS — R0781 Pleurodynia: Secondary | ICD-10-CM

## 2021-04-09 DIAGNOSIS — Y9289 Other specified places as the place of occurrence of the external cause: Secondary | ICD-10-CM | POA: Diagnosis not present

## 2021-04-09 DIAGNOSIS — M940 Chondrocostal junction syndrome [Tietze]: Secondary | ICD-10-CM | POA: Insufficient documentation

## 2021-04-09 DIAGNOSIS — Z87891 Personal history of nicotine dependence: Secondary | ICD-10-CM | POA: Diagnosis not present

## 2021-04-09 MED ORDER — IBUPROFEN 400 MG PO TABS: 600.0000 mg | ORAL_TABLET | Freq: Once | ORAL | Status: DC

## 2021-04-09 NOTE — Discharge Instructions (Addendum)
Motrin 600mg every 6 hours for pain

## 2021-04-09 NOTE — ED Provider Notes (Signed)
[facility] EMERGENCY DEPARTMENT Provider Note   CSN: [id number] Arrival date & time: 04/09/21  1454     History Chief Complaint  Patient presents with   Rib pain    Ana Gordon is a 20 y.o. female.  Patient is a 20 yo female presenting for rib pain. Patient admits to mechanical fall down stairs yesterday. Denies head trauma, hx of blood thinners, or loc. Patient admits to right sided rib pain. Denies sob or dib.   The history is provided by the patient. No language interpreter was used.      Past Medical History:  Diagnosis Date   Heart murmur     There are no problems to display for this patient.   Past Surgical History:  Procedure Laterality Date   NO PAST SURGERIES       OB History     Gravida  2   Para  1   Term  1   Preterm      AB      Living  1      SAB      IAB      Ectopic      Multiple  0   Live Births  1           Family History  Problem Relation Age of Onset   Diabetes Maternal Grandmother    Hypertension Maternal Grandmother    Alzheimer's disease Maternal Grandfather    Hypertension Maternal Aunt    Hypertension Maternal Uncle    Hypertension Cousin     Social History   Tobacco Use   Smoking status: Former    Types: Cigarettes, Cigars    Quit date: 05/18/2018    Years since quitting: 2.8   Smokeless tobacco: Never  Vaping Use   Vaping Use: Never used  Substance Use Topics   Alcohol use: Not Currently    Comment: occ   Drug use: Not Currently    Types: Marijuana    Comment: before pregnancy    Home Medications Prior to Admission medications   Medication Sig Start Date End Date Taking? Authorizing Provider  cephALEXin (KEFLEX) 500 MG capsule Take 1 capsule (500 mg total) by mouth 3 (three) times daily. 02/19/21   Petrucelli, Samantha R, PA-C  LO LOESTRIN FE 1 MG-10 MCG / 10 MCG tablet Take 1 tablet by mouth daily. Patient not taking: Reported on 12/06/2019 03/01/19 01/12/21  Cheral Marker, CNM     Allergies    Bactrim [sulfamethoxazole-trimethoprim]  Review of Systems   Review of Systems  Constitutional:  Negative for chills and fever.  Respiratory:  Negative for chest tightness and shortness of breath.   Cardiovascular:  Negative for chest pain and palpitations.       Right sided rib pain  Gastrointestinal:  Negative for nausea and vomiting.  Neurological:  Negative for weakness and numbness.   Physical Exam Updated Vital Signs BP (!) 102/37 (BP Location: Right Arm)   Pulse 74   Temp 98.6 F (37 C) (Oral)   Resp 17   Ht 5\' 3"  (1.6 m)   Wt 70.2 kg   LMP 02/11/2021 (Approximate)   SpO2 99%   BMI 27.42 kg/m   Physical Exam Vitals and nursing note reviewed.  Constitutional:      Appearance: Normal appearance.  HENT:     Head: Normocephalic and atraumatic.  Cardiovascular:     Rate and Rhythm: Normal rate and regular rhythm.  Pulmonary:     Effort: Pulmonary  effort is normal.     Breath sounds: Normal breath sounds.  Chest:     Chest wall: Tenderness present.    Skin:    Capillary Refill: Capillary refill takes less than 2 seconds.  Neurological:     General: No focal deficit present.     Mental Status: She is alert and oriented to person, place, and time.  Psychiatric:        Mood and Affect: Mood normal.        Behavior: Behavior normal.    ED Results / Procedures / Treatments   Labs (all labs ordered are listed, but only abnormal results are displayed) Labs Reviewed - No data to display  EKG None  Radiology DG Chest 2 View  Result Date: 04/09/2021 CLINICAL DATA:  Fall, rib pain EXAM: CHEST - 2 VIEW COMPARISON:  Chest radiograph 12/06/2019 FINDINGS: The cardiomediastinal silhouette is within normal limits. The lungs are clear, with no focal consolidation or pulmonary edema. There is no pleural effusion or pneumothorax. There is no acute osseous abnormality. Specifically, no displaced rib fracture is identified. IMPRESSION: No acute  cardiopulmonary process or displaced rib fracture identified. Electronically Signed   By: Lesia Hausen M.D.   On: 04/09/2021 16:50    Procedures Procedures   Medications Ordered in ED Medications - No data to display  ED Course  I have reviewed the triage vital signs and the nursing notes.  Pertinent labs & imaging results that were available during my care of the patient were reviewed by me and considered in my medical decision making (see chart for details).    MDM Rules/Calculators/A&P                         6:01 PM 20 yo female presenting for rib pain. Patient is Aox3, no acute distress, afebrile, with stable vitals. Physical exam demonstrates reproducible pain on anterior chest right rib 9 and 10. No gross deformities. No ecchymosis. Xray demonstrates no fractures. Motrin given for pain.   Patient in no distress and overall condition improved here in the ED. Motrin 600 mg Q 6 hours for pain recommended. Pt declines prescription and will take OTC. Detailed discussions were had with the patient regarding current findings, and need for close f/u with PCP or on call doctor. The patient has been instructed to return immediately if the symptoms worsen in any way for re-evaluation. Patient verbalized understanding and is in agreement with current care plan. All questions answered prior to discharge.  Final Clinical Impression(s) / ED Diagnoses Final diagnoses:  Rib pain  Costochondritis    Rx / DC Orders ED Discharge Orders     None        Franne Forts, DO 04/09/21 1804

## 2021-04-09 NOTE — ED Notes (Signed)
Pt refused d/c instructions, left without paperwork.

## 2021-04-09 NOTE — ED Triage Notes (Signed)
Pt states she fell down some stairs yesterday and now is c/o R rib pain.

## 2021-06-13 ENCOUNTER — Emergency Department (HOSPITAL_COMMUNITY)
Admission: EM | Admit: 2021-06-13 | Discharge: 2021-06-13 | Disposition: A | Discharge status: Home / Self Care | Payer: Medicaid Other | Attending: Emergency Medicine | Admitting: Emergency Medicine

## 2021-06-13 ENCOUNTER — Encounter (HOSPITAL_COMMUNITY): Payer: Self-pay | Admitting: *Deleted

## 2021-06-13 ENCOUNTER — Emergency Department (HOSPITAL_COMMUNITY): Payer: Medicaid Other

## 2021-06-13 DIAGNOSIS — Z87891 Personal history of nicotine dependence: Secondary | ICD-10-CM | POA: Diagnosis not present

## 2021-06-13 DIAGNOSIS — R519 Headache, unspecified: Secondary | ICD-10-CM | POA: Diagnosis present

## 2021-06-13 LAB — PREGNANCY, URINE: Preg Test, Ur: NEGATIVE

## 2021-06-13 MED ORDER — ACETAMINOPHEN 500 MG PO TABS
1000.0000 mg | ORAL_TABLET | Freq: Once | ORAL | Status: AC
  Administered 2021-06-13: 1000 mg via ORAL
  Filled 2021-06-13: qty 2

## 2021-06-13 NOTE — ED Provider Notes (Signed)
[facility] EMERGENCY DEPARTMENT Provider Note   CSN: [id number] Arrival date & time: 06/13/21  1826     History Chief Complaint  Patient presents with   Headache    Ana Gordon is a 20 y.o. female.  HPI  Patient with no significant medical history presents to the emergency department with chief complaint of a headache.  Patient states she has been having this headache for 2 weeks time, she states it is constant, is worse at nighttime, she mainly feels around her temples, will have associated photophobia but denies increased any to noise, change in vision, paresthesias or weakness upper lower extremities, she denies any nausea, vomiting, lightheaded or dizziness.  She denies any recent head trauma, is not on anticoagulant.  She denies  neck pain, chest pain, shortness of breath, peripheral edema, no associated fevers or chills, she denies history of IV drug use.  She states that this headache feels little bit different than usual, normally does not last this long.  She also notes that she missed her Menstrual cycle, she states she is approximately 2 weeks late, she states she is generally regular, she denies pelvic pain, vaginal discharge, vaginal bleeding.  Patient requested a pregnancy test. Past Medical History:  Diagnosis Date   Heart murmur     There are no problems to display for this patient.   Past Surgical History:  Procedure Laterality Date   NO PAST SURGERIES       OB History     Gravida  2   Para  1   Term  1   Preterm      AB      Living  1      SAB      IAB      Ectopic      Multiple  0   Live Births  1           Family History  Problem Relation Age of Onset   Diabetes Maternal Grandmother    Hypertension Maternal Grandmother    Alzheimer's disease Maternal Grandfather    Hypertension Maternal Aunt    Hypertension Maternal Uncle    Hypertension Cousin     Social History   Tobacco Use   Smoking status: Former    Types:  Cigarettes, Cigars    Quit date: 05/18/2018    Years since quitting: 3.0   Smokeless tobacco: Never  Vaping Use   Vaping Use: Never used  Substance Use Topics   Alcohol use: Not Currently    Comment: occ   Drug use: Not Currently    Types: Marijuana    Comment: before pregnancy    Home Medications Prior to Admission medications   Medication Sig Start Date End Date Taking? Authorizing Provider  ibuprofen (ADVIL) 800 MG tablet Take by mouth. 10/29/20  Yes [provider]  sertraline (ZOLOFT) 50 MG tablet Take 1 tablet by mouth daily. 11/01/20 11/01/21 Yes [provider]  cephALEXin (KEFLEX) 500 MG capsule Take 1 capsule (500 mg total) by mouth 3 (three) times daily. 02/19/21   Petrucelli, Pleas Koch, PA-C  Prenatal Vit-Fe Fumarate-FA (PNV PRENATAL PLUS MULTIVITAMIN) 27-1 MG TABS Take 1 tablet by mouth daily.    [provider]  LO LOESTRIN FE 1 MG-10 MCG / 10 MCG tablet Take 1 tablet by mouth daily. Patient not taking: Reported on 12/06/2019 03/01/19 01/12/21  Cheral Marker, CNM    Allergies    Bactrim [sulfamethoxazole-trimethoprim]  Review of Systems   Review  of Systems  Constitutional:  Negative for chills and fever.  HENT:  Negative for congestion.   Eyes:  Positive for photophobia. Negative for visual disturbance.  Respiratory:  Negative for shortness of breath.   Cardiovascular:  Negative for chest pain.  Gastrointestinal:  Negative for abdominal pain.  Genitourinary:  Negative for enuresis.  Musculoskeletal:  Negative for back pain.  Skin:  Negative for rash.  Neurological:  Positive for headaches. Negative for dizziness.  Hematological:  Does not bruise/bleed easily.   Physical Exam Updated Vital Signs BP 109/68 (BP Location: Left Arm)   Pulse 75   Temp 98 F (36.7 C) (Oral)   Resp 17   LMP  (LMP Unknown)   SpO2 98%   Physical Exam Vitals and nursing note reviewed.  Constitutional:      General: She is not in acute distress.     Appearance: She is not ill-appearing.  HENT:     Head: Normocephalic and atraumatic.     Nose: No congestion.  Eyes:     Extraocular Movements: Extraocular movements intact.     Conjunctiva/sclera: Conjunctivae normal.     Pupils: Pupils are equal, round, and reactive to light.  Cardiovascular:     Rate and Rhythm: Normal rate and regular rhythm.     Pulses: Normal pulses.     Heart sounds: No murmur heard.   No friction rub. No gallop.  Pulmonary:     Effort: No respiratory distress.     Breath sounds: No wheezing, rhonchi or rales.  Abdominal:     Palpations: Abdomen is soft.     Tenderness: There is no abdominal tenderness. There is no right CVA tenderness or left CVA tenderness.  Musculoskeletal:     Cervical back: No tenderness.     Comments: Moving all 4 extremities, 5 out of 5 strength, neurovascular fully intact.  Skin:    General: Skin is warm and dry.  Neurological:     Mental Status: She is alert.     GCS: GCS eye subscore is 4. GCS verbal subscore is 5. GCS motor subscore is 6.     Cranial Nerves: No cranial nerve deficit.     Sensory: Sensation is intact.     Motor: No weakness.     Coordination: Romberg sign negative. Finger-Nose-Finger Test normal.     Comments: Cranial nerves II through XII are grossly intact, no difficult word finding, no slurring of her words, able to follow two-step commands, no unilateral weakness present my exam.  Psychiatric:        Mood and Affect: Mood normal.    ED Results / Procedures / Treatments   Labs (all labs ordered are listed, but only abnormal results are displayed) Labs Reviewed  PREGNANCY, URINE    EKG None  Radiology CT Head Wo Contrast  Result Date: 06/13/2021 CLINICAL DATA:  Headache 3 weeks EXAM: CT HEAD WITHOUT CONTRAST TECHNIQUE: Contiguous axial images were obtained from the base of the skull through the vertex without intravenous contrast. COMPARISON:  None. FINDINGS: Brain: No evidence of acute infarction,  hemorrhage, hydrocephalus, extra-axial collection or mass lesion/mass effect. Vascular: No hyperdense vessel or unexpected calcification. Skull: Normal. Negative for fracture or focal lesion. Sinuses/Orbits: No acute finding. Other: None. IMPRESSION: No acute intracranial findings. Electronically Signed   By: Duanne Guess D.O.   On: 06/13/2021 20:39    Procedures Procedures   Medications Ordered in ED Medications  acetaminophen (TYLENOL) tablet 1,000 mg (1,000 mg Oral Given 06/13/21 2108)  ED Course  I have reviewed the triage vital signs and the nursing notes.  Pertinent labs & imaging results that were available during my care of the patient were reviewed by me and considered in my medical decision making (see chart for details).    MDM Rules/Calculators/A&P                          Initial impression-presents with a headache and pregnancy test.  She is alert, does not appear in acute stress, vital signs reassuring.  Due to her atypical headaches will obtain CT head for rule out of mass, will also obtain pregnancy test for further evaluation.    Work-up-CT head negative for acute findings.  Urine pregnancy is negative.  Reassessment-Patient is reassessed she has no complaints this time, patient agreed for discharge.  Rule out-I have low suspicion for intracranial head bleed and or mass as CT head is negative for acute findings.  I have low suspicion for CVA there is no neurodeficits present my exam.  Low suspicion for dissection and or aneurysm of the carotid/vertebral artery as presentation atypical of etiology.  I have low suspicion for meningitis as no meningeal sign present on exam, she is nontoxic-appearing, she has no risk factors.  Suspicion for systemic infection as patient is nontoxic-appearing, vital signs reassuring.  Plan-  Headache since resolved-likely patient is having from a tension-like headache, will recommend over-the-counter pain medications, follow-up PCP for  further evaluation.  Vital signs have remained stable, no indication for hospital admission.  Patient given at home care as well strict return precautions.  Patient verbalized that they understood agreed to said plan.  Final Clinical Impression(s) / ED Diagnoses Final diagnoses:  Bad headache    Rx / DC Orders ED Discharge Orders     None        Carroll Sage, PA-C 06/13/21 2226    Cathren Laine, MD 06/15/21 1539

## 2021-06-13 NOTE — ED Notes (Signed)
Pt walked out without receiving paperwork, repeat VS, or signing for discharge.

## 2021-06-13 NOTE — ED Triage Notes (Signed)
Headache for  a couple of weeks, also states she missed her period

## 2021-06-13 NOTE — Discharge Instructions (Addendum)
Lab work and imaging are all reassuring, please continue with home medications as prescribed.  Recommend over-the-counter pain occasions as needed for headaches.  Please stay hydrated, decrease stress, improve on sleep, exercises to help decrease recurrent headaches.  Please follow PCP for further evaluation.  Also follow-up with neurology if needed.  Come back to the emergency department if you develop chest pain, shortness of breath, severe abdominal pain, uncontrolled nausea, vomiting, diarrhea.

## 2021-07-19 ENCOUNTER — Encounter: Payer: Medicaid Other | Admitting: Obstetrics & Gynecology

## 2021-07-26 ENCOUNTER — Emergency Department (HOSPITAL_COMMUNITY)
Admission: EM | Admit: 2021-07-26 | Discharge: 2021-07-26 | Disposition: A | Discharge status: Home / Self Care | Payer: Medicaid Other | Attending: Emergency Medicine | Admitting: Emergency Medicine

## 2021-07-26 ENCOUNTER — Encounter (HOSPITAL_COMMUNITY): Payer: Self-pay | Admitting: *Deleted

## 2021-07-26 DIAGNOSIS — R350 Frequency of micturition: Secondary | ICD-10-CM | POA: Insufficient documentation

## 2021-07-26 DIAGNOSIS — Z20822 Contact with and (suspected) exposure to covid-19: Secondary | ICD-10-CM | POA: Insufficient documentation

## 2021-07-26 DIAGNOSIS — Z5321 Procedure and treatment not carried out due to patient leaving prior to being seen by health care provider: Secondary | ICD-10-CM | POA: Diagnosis not present

## 2021-07-26 LAB — URINALYSIS, ROUTINE W REFLEX MICROSCOPIC
Bilirubin Urine: NEGATIVE
Glucose, UA: NEGATIVE mg/dL
Hgb urine dipstick: NEGATIVE
Ketones, ur: NEGATIVE mg/dL
Leukocytes,Ua: NEGATIVE
Nitrite: NEGATIVE
Protein, ur: NEGATIVE mg/dL
Specific Gravity, Urine: 1.02 (ref 1.005–1.030)
pH: 7 (ref 5.0–8.0)

## 2021-07-26 LAB — RESP PANEL BY RT-PCR (FLU A&B, COVID) ARPGX2
Influenza A by PCR: NEGATIVE
Influenza B by PCR: NEGATIVE
SARS Coronavirus 2 by RT PCR: NEGATIVE

## 2021-07-26 LAB — PREGNANCY, URINE: Preg Test, Ur: NEGATIVE

## 2021-07-26 NOTE — ED Triage Notes (Signed)
Urinary frequency

## 2021-07-28 ENCOUNTER — Emergency Department (HOSPITAL_COMMUNITY)
Admission: EM | Admit: 2021-07-28 | Discharge: 2021-07-28 | Disposition: A | Discharge status: Home / Self Care | Payer: Medicaid Other | Attending: Emergency Medicine | Admitting: Emergency Medicine

## 2021-07-28 ENCOUNTER — Encounter (HOSPITAL_COMMUNITY): Payer: Self-pay | Admitting: Emergency Medicine

## 2021-07-28 ENCOUNTER — Emergency Department (HOSPITAL_COMMUNITY): Payer: Medicaid Other

## 2021-07-28 ENCOUNTER — Other Ambulatory Visit: Payer: Self-pay

## 2021-07-28 DIAGNOSIS — R0602 Shortness of breath: Secondary | ICD-10-CM | POA: Diagnosis not present

## 2021-07-28 DIAGNOSIS — Z87891 Personal history of nicotine dependence: Secondary | ICD-10-CM | POA: Insufficient documentation

## 2021-07-28 DIAGNOSIS — R61 Generalized hyperhidrosis: Secondary | ICD-10-CM | POA: Insufficient documentation

## 2021-07-28 DIAGNOSIS — R42 Dizziness and giddiness: Secondary | ICD-10-CM | POA: Insufficient documentation

## 2021-07-28 DIAGNOSIS — N9489 Other specified conditions associated with female genital organs and menstrual cycle: Secondary | ICD-10-CM | POA: Insufficient documentation

## 2021-07-28 DIAGNOSIS — R0789 Other chest pain: Secondary | ICD-10-CM | POA: Insufficient documentation

## 2021-07-28 DIAGNOSIS — R079 Chest pain, unspecified: Secondary | ICD-10-CM

## 2021-07-28 LAB — I-STAT BETA HCG BLOOD, ED (MC, WL, AP ONLY): I-stat hCG, quantitative: <5 m[IU]/mL (ref <5)

## 2021-07-28 NOTE — Discharge Instructions (Addendum)
You are seen in today for evaluation of your chest pain.  Your chest x-ray was negative.  As discussed, your EKG showed sinus arrhythmia.  You can follow-up with cardiology if you have any continuation being of symptoms for reevaluation.  If you have any worsening chest pain, shortness of breath, leg swelling, syncope, lightheadedness, please return to the nearest emergency department for reevaluation.

## 2021-07-28 NOTE — ED Provider Notes (Signed)
[facility] EMERGENCY DEPARTMENT Provider Note   CSN: [id number] Arrival date & time: 07/28/21  5638     History Chief Complaint  Patient presents with   Chest Pain    Ana Gordon is a 20 y.o. female presents to the ED for evaluation of constant sternal chest pressure along with SOB and diaphoresis for 3 days.  She reports her shortness of breath is more of an "uncomfortable "feeling when needed take a deep breath.  Today, the patient reports she had episode of lightheadedness while at work.  Denies any syncope.  She denies any palpitations.  Denies any hemoptysis, nausea, vomiting, abdominal pain, cough, nasal congestion, rhinorrhea, sore throat, leg swelling, or syncope.  LMP 07-07-2021.  She denies any exogenous hormone use, long travel, recent surgeries, or previous DVT/PE.  Medical history pertinent of PDA and ADHD.  Denies any surgical surgical history.  No daily medications.  Allergic to Bactrim.  Patient reports she does smoke, denies any EtOH use.  Marijuana user.   Chest Pain Associated symptoms: diaphoresis and shortness of breath   Associated symptoms: no abdominal pain, no back pain, no cough, no dizziness, no fever, no headache, no nausea, no palpitations, no vomiting and no weakness       Past Medical History:  Diagnosis Date   Heart murmur     There are no problems to display for this patient.   Past Surgical History:  Procedure Laterality Date   NO PAST SURGERIES       OB History     Gravida  2   Para  1   Term  1   Preterm      AB      Living  1      SAB      IAB      Ectopic      Multiple  0   Live Births  1           Family History  Problem Relation Age of Onset   Diabetes Maternal Grandmother    Hypertension Maternal Grandmother    Alzheimer's disease Maternal Grandfather    Hypertension Maternal Aunt    Hypertension Maternal Uncle    Hypertension Cousin     Social History   Tobacco Use   Smoking status: Former     Types: Cigarettes, Cigars    Quit date: 05/18/2018    Years since quitting: 3.1   Smokeless tobacco: Never  Vaping Use   Vaping Use: Never used  Substance Use Topics   Alcohol use: Not Currently    Comment: occ   Drug use: Not Currently    Types: Marijuana    Comment: 4 days ago    Home Medications Prior to Admission medications   Medication Sig Start Date End Date Taking? Authorizing Provider  cephALEXin (KEFLEX) 500 MG capsule Take 1 capsule (500 mg total) by mouth 3 (three) times daily. 02/19/21   Petrucelli, Samantha R, PA-C  ibuprofen (ADVIL) 800 MG tablet Take by mouth. 10/29/20   [provider]  Prenatal Vit-Fe Fumarate-FA (PNV PRENATAL PLUS MULTIVITAMIN) 27-1 MG TABS Take 1 tablet by mouth daily.    [provider]  sertraline (ZOLOFT) 50 MG tablet Take 1 tablet by mouth daily. 11/01/20 11/01/21  [provider]  LO LOESTRIN FE 1 MG-10 MCG / 10 MCG tablet Take 1 tablet by mouth daily. Patient not taking: Reported on 12/06/2019 03/01/19 01/12/21  Cheral Marker, CNM    Allergies  Bactrim [sulfamethoxazole-trimethoprim]  Review of Systems   Review of Systems  Constitutional:  Positive for diaphoresis. Negative for chills and fever.  HENT:  Negative for congestion, ear pain, rhinorrhea and sore throat.   Eyes:  Negative for pain and visual disturbance.  Respiratory:  Positive for shortness of breath. Negative for cough.   Cardiovascular:  Positive for chest pain. Negative for palpitations.  Gastrointestinal:  Negative for abdominal pain, constipation, diarrhea, nausea and vomiting.  Genitourinary:  Negative for dysuria and hematuria.  Musculoskeletal:  Negative for arthralgias, back pain and myalgias.  Skin:  Negative for color change and rash.  Neurological:  Positive for light-headedness. Negative for dizziness, seizures, syncope, facial asymmetry, weakness and headaches.  All other systems reviewed and are negative.  Physical  Exam Updated Vital Signs BP (!) 101/56   Pulse (!) 59   Temp 98 F (36.7 C) (Oral)   Resp (!) 21   Ht 5\' 3"  (1.6 m)   Wt 70.2 kg   LMP 07/15/2021   SpO2 100%   BMI 27.42 kg/m   Physical Exam Vitals and nursing note reviewed.  Constitutional:      General: She is not in acute distress.    Appearance: Normal appearance. She is not toxic-appearing.  HENT:     Head: Normocephalic and atraumatic.  Eyes:     General: No scleral icterus. Cardiovascular:     Rate and Rhythm: Normal rate. Rhythm irregular.     Heart sounds: Normal heart sounds. No murmur heard.    Comments: Sinus arrhythmia not associated with respirations.  No murmur. Pulmonary:     Effort: Pulmonary effort is normal. No accessory muscle usage or respiratory distress.     Breath sounds: Normal breath sounds.  Chest:     Chest wall: No deformity, tenderness or crepitus.  Abdominal:     General: Abdomen is flat. Bowel sounds are normal.     Palpations: Abdomen is soft.     Tenderness: There is no abdominal tenderness. There is no guarding or rebound.  Musculoskeletal:        General: No deformity.     Cervical back: Normal range of motion.     Right lower leg: No tenderness. No edema.     Left lower leg: No tenderness. No edema.     Comments: Compartment soft.  Palpable pulses.  No swelling or edema.  Skin:    General: Skin is warm and dry.  Neurological:     General: No focal deficit present.     Mental Status: She is alert. Mental status is at baseline.     Cranial Nerves: No cranial nerve deficit.     Motor: No weakness.     Comments: cranial nerves II through XII grossly intact.  No pronator drift.  Strength out of 5 in upper and lower bilateral extremities.  Sensation intact.    ED Results / Procedures / Treatments   Labs (all labs ordered are listed, but only abnormal results are displayed) Labs Reviewed  I-STAT BETA HCG BLOOD, ED ([facility], [facility], [facility] ONLY)    EKG EKG  Interpretation  Date/Time:  Sunday July 28 2021 09:30:30 EST Ventricular Rate:  57 PR Interval:  130 QRS Duration: 102 QT Interval:  423 QTC Calculation: 412 R Axis:   92 Text Interpretation: Sinus rhythm Borderline right axis deviation Confirmed by Norman Clay ([id number]) on 07/28/2021 9:54:09 AM  Radiology DG Chest 2 View  Result Date: 07/28/2021 CLINICAL DATA:  Chest pain and shortness of  breath 2 days. EXAM: CHEST - 2 VIEW COMPARISON:  04/09/2021 FINDINGS: Lungs are adequately inflated and otherwise clear. Cardiac silhouette is normal. Mild stable prominence of the main pulmonary artery segment. Bones and soft tissues are normal. IMPRESSION: No active cardiopulmonary disease. Electronically Signed   By: Marin Olp M.D.   On: 07/28/2021 10:37    Procedures Procedures   Medications Ordered in ED Medications - No data to display  ED Course  I have reviewed the triage vital signs and the nursing notes.  Pertinent labs & imaging results that were available during my care of the patient were reviewed by me and considered in my medical decision making (see chart for details).  20 year old female presents emergency department for evaluation of chest pain, shortness of breath for the past 3 days.  States she experienced an episode of lightheadedness.  Differential diagnosis includes but is not limited to PE, pneumonia, pneumothorax, ACS.  The patient is PERC negative and low Wells criteria.  Low suspicion for PE.  Chest x-ray shows no signs of pneumonia or pneumothorax.  Mild stable prominence of the main pulmonary artery segment, otherwise no active cardiopulmonary disease.  EKG shows sinus rhythm with borderline right axis deviation.  Rhythm strip printed and the patient was experiencing a sinus arrhythmia not correlated with respiration.  No STEMI.  She denies any palpitations.  I discussed with the patient her chest x-ray findings.  Strict return precautions given such as worsening  chest pain, shortness of breath, leg swelling, syncope, or nonimprovement of any symptoms to return to the emergency department for reevaluation.  I referred her to cardiology.  Recommended she call to follow-up to the appointment.  Patient agrees with plan.  Patient is stable being discharged home in good condition.  I discussed this case with my attending physician who cosigned this note including patient's presenting symptoms, physical exam, and planned diagnostics and interventions. Attending physician stated agreement with plan or made changes to plan which were implemented.    MDM Rules/Calculators/A&P                          Final Clinical Impression(s) / ED Diagnoses Final diagnoses:  Chest pain, unspecified type    Rx / DC Orders ED Discharge Orders     None        Sherrell Puller, Hershal Coria 07/28/21 1138    Luna Fuse, MD 07/29/21 [phone]

## 2021-07-28 NOTE — ED Triage Notes (Signed)
Pt to the ED with central chest pain that began 2 days ago. Pt was recently seen and tested negative for flu and covid.

## 2021-07-31 ENCOUNTER — Encounter (HOSPITAL_COMMUNITY): Payer: Self-pay | Admitting: *Deleted

## 2021-07-31 ENCOUNTER — Emergency Department (HOSPITAL_COMMUNITY)
Admission: EM | Admit: 2021-07-31 | Discharge: 2021-07-31 | Disposition: A | Discharge status: Home / Self Care | Payer: Medicaid Other | Attending: Emergency Medicine | Admitting: Emergency Medicine

## 2021-07-31 ENCOUNTER — Other Ambulatory Visit: Payer: Self-pay

## 2021-07-31 DIAGNOSIS — Z87891 Personal history of nicotine dependence: Secondary | ICD-10-CM | POA: Insufficient documentation

## 2021-07-31 DIAGNOSIS — R0600 Dyspnea, unspecified: Secondary | ICD-10-CM | POA: Diagnosis not present

## 2021-07-31 DIAGNOSIS — R079 Chest pain, unspecified: Secondary | ICD-10-CM | POA: Diagnosis present

## 2021-07-31 LAB — BASIC METABOLIC PANEL
Anion gap: 7 (ref 5–15)
BUN: 17 mg/dL (ref 6–20)
CO2: 27 mmol/L (ref 22–32)
Calcium: 8.8 mg/dL — ABNORMAL LOW (ref 8.9–10.3)
Chloride: 103 mmol/L (ref 98–111)
Creatinine, Ser: 0.64 mg/dL (ref 0.44–1.00)
GFR, Estimated: >60 mL/min (ref >60)
Glucose, Bld: 82 mg/dL (ref 70–99)
Potassium: 3.6 mmol/L (ref 3.5–5.1)
Sodium: 137 mmol/L (ref 135–145)

## 2021-07-31 LAB — D-DIMER, QUANTITATIVE: D-Dimer, Quant: <0.27 ug/mL-FEU (ref 0.00–0.50)

## 2021-07-31 LAB — CBC
HCT: 37.2 % (ref 36.0–46.0)
Hemoglobin: 12.2 g/dL (ref 12.0–15.0)
MCH: 28.5 pg (ref 26.0–34.0)
MCHC: 32.8 g/dL (ref 30.0–36.0)
MCV: 86.9 fL (ref 80.0–100.0)
Platelets: 190 10*3/uL (ref 150–400)
RBC: 4.28 MIL/uL (ref 3.87–5.11)
RDW: 12.7 % (ref 11.5–15.5)
WBC: 5.1 10*3/uL (ref 4.0–10.5)
nRBC: 0 % (ref 0.0–0.2)

## 2021-07-31 LAB — TROPONIN I (HIGH SENSITIVITY): Troponin I (High Sensitivity): <2 ng/L (ref <18)

## 2021-07-31 NOTE — ED Triage Notes (Addendum)
Pt with continued CP and feelings of being out of breath.  Pt seen for the same on 07/28/21 and has scheduled a cardiologist appt on 08/23/21 (earliest appt pt able to make).

## 2021-07-31 NOTE — ED Provider Notes (Signed)
[facility] EMERGENCY DEPARTMENT Provider Note   CSN: [id number] Arrival date & time: 07/31/21  1558     History Chief Complaint  Patient presents with   Chest Pain    Ana Gordon is a 20 y.o. female.   Chest Pain Associated symptoms: shortness of breath   Associated symptoms: no back pain, no fatigue, no fever and no weakness   Patient has had a week or 2 with chest pain and shortness of breath.  Has had work-up through the ER 3 days ago.  Although it involved an EKG and a chest x-ray.  These were reassuring.  Sent to follow-up with cardiology.  States feels more short of breath when she takes deep breath.  States decreased activity due to shortness of breath.  Unable to walk the same.  Get some chest pain at times.  No swelling her legs.  Did previously smoke but has not smoked recently.  Has not had episodes like this before.  Did deliver her child about 9 months ago.  States she had been doing since then without any difficulty breathing however.  No fevers.  No coughing.      Past Medical History:  Diagnosis Date   Heart murmur     There are no problems to display for this patient.   Past Surgical History:  Procedure Laterality Date   NO PAST SURGERIES       OB History     Gravida  2   Para  1   Term  1   Preterm      AB      Living  1      SAB      IAB      Ectopic      Multiple  0   Live Births  1           Family History  Problem Relation Age of Onset   Diabetes Maternal Grandmother    Hypertension Maternal Grandmother    Alzheimer's disease Maternal Grandfather    Hypertension Maternal Aunt    Hypertension Maternal Uncle    Hypertension Cousin     Social History   Tobacco Use   Smoking status: Former    Types: Cigarettes, Cigars    Quit date: 05/18/2018    Years since quitting: 3.2   Smokeless tobacco: Never  Vaping Use   Vaping Use: Never used  Substance Use Topics   Alcohol use: Not Currently    Comment: occ    Drug use: Not Currently    Types: Marijuana    Comment: 4 days ago    Home Medications Prior to Admission medications   Medication Sig Start Date End Date Taking? Authorizing Provider  cephALEXin (KEFLEX) 500 MG capsule Take 1 capsule (500 mg total) by mouth 3 (three) times daily. 02/19/21   Petrucelli, Samantha R, PA-C  ibuprofen (ADVIL) 800 MG tablet Take by mouth. 10/29/20   [provider]  Prenatal Vit-Fe Fumarate-FA (PNV PRENATAL PLUS MULTIVITAMIN) 27-1 MG TABS Take 1 tablet by mouth daily.    [provider]  sertraline (ZOLOFT) 50 MG tablet Take 1 tablet by mouth daily. 11/01/20 11/01/21  [provider]  LO LOESTRIN FE 1 MG-10 MCG / 10 MCG tablet Take 1 tablet by mouth daily. Patient not taking: Reported on 12/06/2019 03/01/19 01/12/21  Cheral Marker, CNM    Allergies    Bactrim [sulfamethoxazole-trimethoprim]  Review of Systems   Review of Systems  Constitutional:  Negative for  appetite change, fatigue and fever.  HENT:  Negative for congestion.   Respiratory:  Positive for shortness of breath.   Cardiovascular:  Positive for chest pain.  Gastrointestinal:  Negative for abdominal distention.  Genitourinary:  Negative for flank pain.  Musculoskeletal:  Negative for back pain.  Skin:  Negative for rash.  Neurological:  Negative for weakness.  Psychiatric/Behavioral:  Negative for confusion.    Physical Exam Updated Vital Signs BP 120/77 (BP Location: Right Arm)    Pulse 71    Temp 97.7 F (36.5 C) (Temporal)    Resp 16    Ht 5\' 3"  (1.6 m)    Wt 65.8 kg    LMP 07/21/2021    SpO2 100%    Breastfeeding No    BMI 25.69 kg/m   Physical Exam Constitutional:      Appearance: She is well-developed.  Cardiovascular:     Rate and Rhythm: Regular rhythm.     Heart sounds: No murmur heard. Pulmonary:     Breath sounds: No wheezing, rhonchi or rales.  Chest:     Chest wall: No tenderness.  Abdominal:     Tenderness: There is no abdominal tenderness.   Musculoskeletal:     Cervical back: Normal range of motion.     Right lower leg: No edema.     Left lower leg: No edema.  Skin:    General: Skin is warm.     Capillary Refill: Capillary refill takes less than 2 seconds.  Neurological:     Mental Status: She is alert and oriented to person, place, and time.    ED Results / Procedures / Treatments   Labs (all labs ordered are listed, but only abnormal results are displayed) Labs Reviewed  BASIC METABOLIC PANEL - Abnormal; Notable for the following components:      Result Value   Calcium 8.8 (*)    All other components within normal limits  CBC  D-DIMER, QUANTITATIVE  TROPONIN I (HIGH SENSITIVITY)    EKG EKG Interpretation  Date/Time:  Wednesday July 31 2021 17:01:09 EST Ventricular Rate:  69 PR Interval:  158 QRS Duration: 96 QT Interval:  400 QTC Calculation: 428 R Axis:   79 Text Interpretation: Normal sinus rhythm with sinus arrhythmia Normal ECG Confirmed by [id number] [phone]) on 07/31/2021 5:08:01 PM  Radiology No results found.  Procedures Procedures   Medications Ordered in ED Medications - No data to display  ED Course  I have reviewed the triage vital signs and the nursing notes.  Pertinent labs & imaging results that were available during my care of the patient were reviewed by me and considered in my medical decision making (see chart for details).    MDM Rules/Calculators/A&P                           Patient with shortness of breath.  Some chest pain.  Had for the last around 2 weeks now.  No coughing.  More out of breath.  Gets with standing and worse with exertion.  States she can do less exertion.  Work-up reassuring.  EKG reassuring.  Troponin negative.  D-dimer negative.  Chest x-ray reviewed from 3 days ago and reassuring.  No clear cause found.  Will have follow-up with PCP and cardiology as planned. Final Clinical Impression(s) / ED Diagnoses Final diagnoses:  Dyspnea,  unspecified type    Rx / DC Orders ED Discharge Orders     None  Benjiman Core, MD 07/31/21 [phone]

## 2021-07-31 NOTE — Discharge Instructions (Signed)
Follow-up with cardiology as planned.  You may also need to follow-up with your primary care doctor

## 2021-08-22 DIAGNOSIS — R072 Precordial pain: Secondary | ICD-10-CM | POA: Insufficient documentation

## 2021-08-22 DIAGNOSIS — R0602 Shortness of breath: Secondary | ICD-10-CM | POA: Insufficient documentation

## 2021-08-22 NOTE — Progress Notes (Signed)
Cardiology Office Note   Date:  08/23/2021   ID:  Ana Gordon, DOB 06-22-2001, MRN [id number]  PCP:  Marcellus Scott, DO  Cardiologist:   Minus Breeding, MD Referring:  Marcellus Scott, DO  Chief Complaint  Patient presents with   Shortness of Breath      History of Present Illness: Ana Gordon is a 21 y.o. female who is referred by the ED for evaluation of chest pain.  She was in the emergency room for this in mid December.  I reviewed these records.  EKG was unremarkable.  Enzymes were unremarkable.    D-dimer was negative.  Chest x-ray did not demonstrate any acute findings.  She has been told that she has a heart murmur.    She is actually getting I reviewed records on 12/11 and 12/15.  She has discomfort.  This was at rest.  It would happen when she was sitting.  It was associated shortness of breath.  It was mid chest.  It would be sharp.  It was for taking a deep breath and maybe lying flat on her back.  She described it also as a burning discomfort.  Enzymes were negative.  Chest x-ray was unremarkable.  She was treated ultimately with ibuprofen.  She said that over the last couple of days she has not really had any discomfort and has been more intermittent since she was last in the emergency room.  Still she gets occasional discomfort that might last for short while and again be more positional or with deep breathing.  She denies any cough fevers or chills.  She thought it always COVID because it was similar to what she had in January 2021.  At that time she had headache as well.  Her only cardiac history has been she was told she had a heart murmur in the past.  She is never had any other cardiac work-up.   Past Medical History:  Diagnosis Date   Heart murmur     Past Surgical History:  Procedure Laterality Date   NO PAST SURGERIES       No current outpatient medications on file.   No current facility-administered medications for this  visit.    Allergies:   Bactrim [sulfamethoxazole-trimethoprim]    Social History:  The patient  reports that she has quit smoking. Her smoking use included cigarettes and cigars. She has never used smokeless tobacco. She reports that she does not currently use alcohol. She reports that she does not currently use drugs after having used the following drugs: Marijuana.   Family History:  The patient's family history includes Alzheimer's disease in her maternal grandfather; Diabetes in her maternal grandmother; Hypertension in her cousin, maternal aunt, maternal grandmother, maternal uncle, and mother.    ROS:  Please see the history of present illness.   Otherwise, review of systems are positive for none.   All other systems are reviewed and negative.    PHYSICAL EXAM: VS:  BP 98/60 (BP Location: Left Arm, Patient Position: Sitting, Cuff Size: Normal)    Pulse 60    Resp 20    Ht '5\' 3"'  (1.6 m)    Wt 174 lb 6.4 oz (79.1 kg)    LMP 08/22/2021 (Exact Date)    SpO2 98%    BMI 30.89 kg/m  , BMI Body mass index is 30.89 kg/m. GENERAL:  Well appearing HEENT:  Pupils equal round and reactive, fundi not visualized, oral mucosa unremarkable NECK:  No jugular  venous distention, waveform within normal limits, carotid upstroke brisk and symmetric, no bruits, no thyromegaly LYMPHATICS:  No cervical, inguinal adenopathy LUNGS:  Clear to auscultation bilaterally BACK:  No CVA tenderness CHEST:  Unremarkable HEART:  PMI not displaced or sustained,S1 and S2 within normal limits, no S3, no S4, no clicks, no rubs, no murmurs ABD:  Flat, positive bowel sounds normal in frequency in pitch, no bruits, no rebound, no guarding, no midline pulsatile mass, no hepatomegaly, no splenomegaly EXT:  2 plus pulses throughout, no edema, no cyanosis no clubbing SKIN:  No rashes no nodules NEURO:  Cranial nerves II through XII grossly intact, motor grossly intact throughout PSYCH:  Cognitively intact, oriented to person  place and time    EKG:  EKG is ordered today. The ekg ordered today demonstrates sinus rhythm, rate 60, axis within normal limits, intervals within normal, no acute ST-T wave changes.   Recent Labs: 07/31/2021: BUN 17; Creatinine, Ser 0.64; Hemoglobin 12.2; Platelets 190; Potassium 3.6; Sodium 137    Lipid Panel No results found for: CHOL, TRIG, HDL, CHOLHDL, VLDL, LDLCALC, LDLDIRECT    Wt Readings from Last 3 Encounters:  08/23/21 174 lb 6.4 oz (79.1 kg)  07/31/21 145 lb (65.8 kg)  07/28/21 154 lb 12.2 oz (70.2 kg)      Other studies Reviewed: Additional studies/ records that were reviewed today include: ED records. Review of the above records demonstrates:  Please see elsewhere in the note.     ASSESSMENT AND PLAN:  PRECORDIAL CHEST PAIN: Her chest discomfort does not sound anginal.  I do not strongly suspect a cardiac etiology although this could be an inflammatory process.  I will check a CRP and ESR.  If these are negative I would not suggest further cardiac work-up.  MURMUR: She does not have a murmur.  Exam is unremarkable.  No further work-up is suggested.   Current medicines are reviewed at length with the patient today.  The patient does not have concerns regarding medicines.  The following changes have been made:  no change  Labs/ tests ordered today include: None  Orders Placed This Encounter  Procedures   C-reactive protein   Sed Rate (ESR)   EKG 12-Lead     Disposition:   FU with me as needed.      Signed, Minus Breeding, MD  08/23/2021 4:04 PM    [facility]

## 2021-08-23 ENCOUNTER — Other Ambulatory Visit: Payer: Self-pay

## 2021-08-23 ENCOUNTER — Encounter: Payer: Self-pay | Admitting: Cardiology

## 2021-08-23 ENCOUNTER — Ambulatory Visit (INDEPENDENT_AMBULATORY_CARE_PROVIDER_SITE_OTHER): Payer: Medicaid Other | Admitting: Cardiology

## 2021-08-23 VITALS — BP 98/60 | HR 60 | Resp 20 | Ht 63.0 in | Wt 174.4 lb

## 2021-08-23 DIAGNOSIS — R072 Precordial pain: Secondary | ICD-10-CM

## 2021-08-23 DIAGNOSIS — R0602 Shortness of breath: Secondary | ICD-10-CM

## 2021-08-23 NOTE — Patient Instructions (Signed)
Medication Instructions:  Continue same medications   Lab Work: Crp and esr today   Testing/Procedures: None ordered   Follow-Up: At [facility], you and your health needs are our priority.  As part of our continuing mission to provide you with exceptional heart care, we have created designated Provider Care Teams.  These Care Teams include your primary Cardiologist (physician) and Advanced Practice Providers (APPs -  Physician Assistants and Nurse Practitioners) who all work together to provide you with the care you need, when you need it.  We recommend signing up for the patient portal called "MyChart".  Sign up information is provided on this After Visit Summary.  MyChart is used to connect with patients for Virtual Visits (Telemedicine).  Patients are able to view lab/test results, encounter notes, upcoming appointments, etc.  Non-urgent messages can be sent to your provider as well.   To learn more about what you can do with MyChart, go to [email or web address].      Your next appointment:  As Needed    The format for your next appointment: Office   Provider:  Dr.Hochrein

## 2021-08-24 LAB — SEDIMENTATION RATE: Sed Rate: 11 mm/hr (ref 0–32)

## 2021-08-24 LAB — C-REACTIVE PROTEIN: CRP: 2 mg/L (ref 0–10)

## 2021-08-26 ENCOUNTER — Encounter: Payer: Self-pay | Admitting: *Deleted

## 2021-08-26 ENCOUNTER — Emergency Department (HOSPITAL_COMMUNITY): Payer: Medicaid Other

## 2021-08-26 ENCOUNTER — Other Ambulatory Visit: Payer: Self-pay

## 2021-08-26 ENCOUNTER — Emergency Department (HOSPITAL_COMMUNITY)
Admission: EM | Admit: 2021-08-26 | Discharge: 2021-08-27 | Disposition: A | Discharge status: Home / Self Care | Payer: Medicaid Other | Attending: Emergency Medicine | Admitting: Emergency Medicine

## 2021-08-26 ENCOUNTER — Encounter (HOSPITAL_COMMUNITY): Payer: Self-pay

## 2021-08-26 DIAGNOSIS — R1011 Right upper quadrant pain: Secondary | ICD-10-CM | POA: Diagnosis present

## 2021-08-26 DIAGNOSIS — K8020 Calculus of gallbladder without cholecystitis without obstruction: Secondary | ICD-10-CM | POA: Diagnosis not present

## 2021-08-26 LAB — CBC WITH DIFFERENTIAL/PLATELET
Abs Immature Granulocytes: 0.02 10*3/uL (ref 0.00–0.07)
Basophils Absolute: 0 10*3/uL (ref 0.0–0.1)
Basophils Relative: 0 %
Eosinophils Absolute: 0.1 10*3/uL (ref 0.0–0.5)
Eosinophils Relative: 2 %
HCT: 38.6 % (ref 36.0–46.0)
Hemoglobin: 12.6 g/dL (ref 12.0–15.0)
Immature Granulocytes: 0 %
Lymphocytes Relative: 39 %
Lymphs Abs: 2.7 10*3/uL (ref 0.7–4.0)
MCH: 28.6 pg (ref 26.0–34.0)
MCHC: 32.6 g/dL (ref 30.0–36.0)
MCV: 87.5 fL (ref 80.0–100.0)
Monocytes Absolute: 0.5 10*3/uL (ref 0.1–1.0)
Monocytes Relative: 7 %
Neutro Abs: 3.5 10*3/uL (ref 1.7–7.7)
Neutrophils Relative %: 52 %
Platelets: 222 10*3/uL (ref 150–400)
RBC: 4.41 MIL/uL (ref 3.87–5.11)
RDW: 13.2 % (ref 11.5–15.5)
WBC: 6.8 10*3/uL (ref 4.0–10.5)
nRBC: 0 % (ref 0.0–0.2)

## 2021-08-26 LAB — COMPREHENSIVE METABOLIC PANEL
ALT: 17 U/L (ref 0–44)
AST: 14 U/L — ABNORMAL LOW (ref 15–41)
Albumin: 3.9 g/dL (ref 3.5–5.0)
Alkaline Phosphatase: 48 U/L (ref 38–126)
Anion gap: 5 (ref 5–15)
BUN: 18 mg/dL (ref 6–20)
CO2: 26 mmol/L (ref 22–32)
Calcium: 8.7 mg/dL — ABNORMAL LOW (ref 8.9–10.3)
Chloride: 106 mmol/L (ref 98–111)
Creatinine, Ser: 0.64 mg/dL (ref 0.44–1.00)
GFR, Estimated: >60 mL/min (ref >60)
Glucose, Bld: 115 mg/dL — ABNORMAL HIGH (ref 70–99)
Potassium: 3.8 mmol/L (ref 3.5–5.1)
Sodium: 137 mmol/L (ref 135–145)
Total Bilirubin: 0.4 mg/dL (ref 0.3–1.2)
Total Protein: 7.5 g/dL (ref 6.5–8.1)

## 2021-08-26 LAB — URINALYSIS, ROUTINE W REFLEX MICROSCOPIC
Bilirubin Urine: NEGATIVE
Glucose, UA: NEGATIVE mg/dL
Hgb urine dipstick: NEGATIVE
Ketones, ur: NEGATIVE mg/dL
Leukocytes,Ua: NEGATIVE
Nitrite: NEGATIVE
Protein, ur: NEGATIVE mg/dL
Specific Gravity, Urine: 1.023 (ref 1.005–1.030)
pH: 6 (ref 5.0–8.0)

## 2021-08-26 LAB — LIPASE, BLOOD: Lipase: 29 U/L (ref 11–51)

## 2021-08-26 LAB — POC URINE PREG, ED: Preg Test, Ur: NEGATIVE

## 2021-08-26 MED ORDER — LACTATED RINGERS IV BOLUS
1000.0000 mL | Freq: Once | INTRAVENOUS | Status: AC
  Administered 2021-08-26: 1000 mL via INTRAVENOUS

## 2021-08-26 MED ORDER — IOHEXOL 300 MG/ML  SOLN
100.0000 mL | Freq: Once | INTRAMUSCULAR | Status: AC | PRN
  Administered 2021-08-26: 100 mL via INTRAVENOUS

## 2021-08-26 MED ORDER — ONDANSETRON HCL 4 MG/2ML IJ SOLN
4.0000 mg | Freq: Four times a day (QID) | INTRAMUSCULAR | Status: DC | PRN
  Administered 2021-08-26: 4 mg via INTRAVENOUS
  Filled 2021-08-26: qty 2

## 2021-08-26 MED ORDER — FENTANYL CITRATE PF 50 MCG/ML IJ SOSY: 50.0000 ug | PREFILLED_SYRINGE | INTRAMUSCULAR | Status: DC | PRN

## 2021-08-26 MED ORDER — FENTANYL CITRATE PF 50 MCG/ML IJ SOSY
50.0000 ug | PREFILLED_SYRINGE | Freq: Once | INTRAMUSCULAR | Status: AC
  Administered 2021-08-26: 50 ug via INTRAVENOUS
  Filled 2021-08-26: qty 1

## 2021-08-26 NOTE — ED Notes (Signed)
Patient transported to CT 

## 2021-08-26 NOTE — ED Provider Notes (Signed)
[facility] EMERGENCY DEPARTMENT Provider Note   CSN: [id number] Arrival date & time: 08/26/21  2148     History  Chief Complaint  Patient presents with   Abdominal Pain    Ana Gordon is a 21 y.o. female.   Abdominal Pain Associated symptoms: nausea   Associated symptoms: no chest pain, no chills, no constipation, no cough, no diarrhea, no dysuria, no fatigue, no fever, no hematuria, no shortness of breath, no sore throat, no vaginal bleeding, no vaginal discharge and no vomiting   Patient presents for abdominal pain.  Onset was 2 hours prior to arrival.  At the time, she was taking a shower.  She experienced periumbilical pain that subsequently migrated to her right upper quadrant.  Pain was severe.  She did have associated nausea.  She did not vomit.  Pain was persistent which prompted her to come to the ED.  Currently, she endorses 9/10 severity pain.  She denies current nausea.  Patient ate approximately 1 hour prior to onset of symptoms.  She denies any history of abdominal surgeries.    Home Medications Prior to Admission medications   Medication Sig Start Date End Date Taking? Authorizing Provider  LO LOESTRIN FE 1 MG-10 MCG / 10 MCG tablet Take 1 tablet by mouth daily. Patient not taking: Reported on 12/06/2019 03/01/19 01/12/21  Roma Schanz, CNM      Allergies    Bactrim [sulfamethoxazole-trimethoprim]    Review of Systems   Review of Systems  Constitutional:  Negative for activity change, chills, diaphoresis, fatigue and fever.  HENT:  Negative for congestion, ear pain and sore throat.   Eyes:  Negative for pain and visual disturbance.  Respiratory:  Negative for cough, chest tightness and shortness of breath.   Cardiovascular:  Negative for chest pain and palpitations.  Gastrointestinal:  Positive for abdominal pain and nausea. Negative for abdominal distention, constipation, diarrhea and vomiting.  Genitourinary:  Negative for dysuria, flank pain,  hematuria, pelvic pain, vaginal bleeding and vaginal discharge.  Musculoskeletal:  Negative for arthralgias, back pain, myalgias and neck pain.  Skin:  Negative for color change and rash.  Neurological:  Negative for dizziness, seizures, syncope, weakness, light-headedness, numbness and headaches.  Hematological:  Does not bruise/bleed easily.  Psychiatric/Behavioral:  Negative for confusion and decreased concentration.   All other systems reviewed and are negative.  Physical Exam Updated Vital Signs BP (!) 105/56    Pulse (!) 56    Temp 98.4 F (36.9 C)    Resp 16    Ht 5\' 4"  (1.626 m)    Wt 79.4 kg    LMP 08/12/2021 (Exact Date)    SpO2 99%    BMI 30.04 kg/m  Physical Exam Vitals and nursing note reviewed.  Constitutional:      General: She is not in acute distress.    Appearance: She is well-developed and normal weight. She is not ill-appearing, toxic-appearing or diaphoretic.  HENT:     Head: Normocephalic and atraumatic.     Mouth/Throat:     Mouth: Mucous membranes are moist.     Pharynx: Oropharynx is clear.  Eyes:     Extraocular Movements: Extraocular movements intact.     Conjunctiva/sclera: Conjunctivae normal.  Cardiovascular:     Rate and Rhythm: Normal rate and regular rhythm.     Heart sounds: No murmur heard. Pulmonary:     Effort: Pulmonary effort is normal. No respiratory distress.     Breath sounds: Normal breath sounds. No wheezing  or rales.  Abdominal:     Palpations: Abdomen is soft.     Tenderness: There is abdominal tenderness in the right upper quadrant. There is no guarding or rebound.  Musculoskeletal:        General: No swelling.     Cervical back: Neck supple.  Skin:    General: Skin is warm and dry.     Capillary Refill: Capillary refill takes less than 2 seconds.     Coloration: Skin is not jaundiced or pale.  Neurological:     General: No focal deficit present.     Mental Status: She is alert and oriented to person, place, and time.      Cranial Nerves: No cranial nerve deficit.     Motor: No weakness.  Psychiatric:        Mood and Affect: Mood normal.        Behavior: Behavior normal.    ED Results / Procedures / Treatments   Labs (all labs ordered are listed, but only abnormal results are displayed) Labs Reviewed  COMPREHENSIVE METABOLIC PANEL - Abnormal; Notable for the following components:      Result Value   Glucose, Bld 115 (*)    Calcium 8.7 (*)    AST 14 (*)    All other components within normal limits  LIPASE, BLOOD  CBC WITH DIFFERENTIAL/PLATELET  URINALYSIS, ROUTINE W REFLEX MICROSCOPIC  POC URINE PREG, ED    EKG None  Radiology CT ABDOMEN PELVIS W CONTRAST  Addendum Date: 08/27/2021   ADDENDUM REPORT: 08/26/2021 23:31 ADDENDUM: Additional impression: Cholelithiasis without inflammatory process in the right upper quadrant Electronically Signed   By: Donavan Foil M.D.   On: 08/26/2021 23:31   Result Date: 08/26/2021 CLINICAL DATA:  Right lower quadrant pain EXAM: CT ABDOMEN AND PELVIS WITH CONTRAST TECHNIQUE: Multidetector CT imaging of the abdomen and pelvis was performed using the standard protocol following bolus administration of intravenous contrast. CONTRAST:  157mL OMNIPAQUE IOHEXOL 300 MG/ML  SOLN COMPARISON:  CT 02/01/2018 FINDINGS: Lower chest: No acute abnormality. Hepatobiliary: Multiple gallstones. No biliary dilatation. No focal hepatic abnormality. Pancreas: Unremarkable. No pancreatic ductal dilatation or surrounding inflammatory changes. Spleen: Normal in size without focal abnormality. Adrenals/Urinary Tract: Adrenal glands are unremarkable. Kidneys are normal, without renal calculi, focal lesion, or hydronephrosis. Bladder is unremarkable. Stomach/Bowel: Stomach is within normal limits. Appendix appears normal. No evidence of bowel wall thickening, distention, or inflammatory changes. Vascular/Lymphatic: No significant vascular findings are present. No enlarged abdominal or pelvic lymph  nodes. Reproductive: Uterus and bilateral adnexa are unremarkable. Other: Small free fluid in the pelvis. Mild hazy edema and soft tissue stranding in the right lower quadrant adjacent to the cecum. Musculoskeletal: No acute or significant osseous findings. IMPRESSION: 1. Negative for acute appendicitis. 2. Small free fluid in the pelvis. There is mild edema/soft tissue stranding in the right lower quadrant adjacent to the cecum but no adjacent wall thickening. Findings could be due to nonspecific pelvic inflammatory process. There is no evidence for abscess. Electronically Signed: By: Donavan Foil M.D. On: 08/26/2021 23:25    Procedures Procedures    Medications Ordered in ED Medications  lactated ringers bolus 1,000 mL (0 mLs Intravenous Stopped 08/27/21 0017)  fentaNYL (SUBLIMAZE) injection 50 mcg (50 mcg Intravenous Given 08/26/21 2252)  iohexol (OMNIPAQUE) 300 MG/ML solution 100 mL (100 mLs Intravenous Contrast Given 08/26/21 2258)    ED Course/ Medical Decision Making/ A&P  Medical Decision Making  This patient presents to the ED for concern of abdominal pain, this involves an extensive number of treatment options, and is a complaint that carries with it a high risk of complications and morbidity.  The differential diagnosis includes gastritis, PUD, pancreatitis, cholecystitis, symptomatic cholelithiasis, stretch of liver capsule, constipation, colitis.   Co morbidities that complicate the patient evaluation  None   Additional history obtained:  Additional history obtained from N/A External records from outside source obtained and reviewed including EMR   Lab Tests:  I Ordered, and personally interpreted labs.  The pertinent results include: Absence of leukocytosis, normal hepatobiliary enzymes, normal electrolytes, normal lipase, no evidence of pregnancy or urinary infection.   Imaging Studies ordered:  I ordered imaging studies including: Given no  availability of ultrasound at this time, CT scan of abdomen pelvis was ordered I independently visualized and interpreted imaging which showed cholelithiasis I agree with the radiologist interpretation   Cardiac Monitoring:  The patient was maintained on a cardiac monitor.  I personally viewed and interpreted the cardiac monitored which showed an underlying rhythm of: Sinus rhythm   Medicines ordered and prescription drug management:  I ordered medication including fentanyl for analgesia Reevaluation of the patient after these medicines showed that the patient resolved I have reviewed the patients home medicines and have made adjustments as needed   Test Considered:  Right upper quadrant ultrasound, not currently available at this facility  Problem List / ED Course:  Healthy 21 year old female presenting for sudden onset of abdominal pain.  Initially it was located in the periumbilical area and it migrated to the right upper quadrant.  Tenderness is present on exam.  Given absence of ultrasound capability at this facility, at this time of night, CT scan of abdomen pelvis was ordered.  Results of CT scan showed cholelithiasis.  There was not evidence of cholecystitis.  Additionally, patient's labs showed no leukocytosis and no elevation in hepatobiliary enzymes.  Patient's symptoms likely secondary to symptomatic cholelithiasis.  On reassessment, patient had resolution of pain.  She is stable for discharge at this time.  She was advised to call general surgery office in the morning to schedule a follow-up appointment and to return to the ED for any worsening symptoms.  Complications from cholelithiasis were discussed at bedside.  Patient expressed understanding.  She was discharged in good condition.   Reevaluation:  After the interventions noted above, I reevaluated the patient and found that they have :resolved  Dispostion:  After consideration of the diagnostic results and the  patients response to treatment, I feel that the patent would benefit from discharge with close follow-up by general surgery.          Final Clinical Impression(s) / ED Diagnoses Final diagnoses:  Symptomatic cholelithiasis    Rx / DC Orders ED Discharge Orders     None         Godfrey Pick, MD 08/28/21 [phone]

## 2021-08-26 NOTE — ED Triage Notes (Signed)
Abdominal pain that began approx 2 hours ago, Pt states it started at her umbilicus and has radiated to her RLQ.   Pt states she is sexually active and is not currently using BC.

## 2021-09-03 ENCOUNTER — Ambulatory Visit: Payer: Medicaid Other | Admitting: Surgery

## 2021-09-11 ENCOUNTER — Ambulatory Visit: Payer: Medicaid Other | Admitting: Surgery

## 2021-09-17 ENCOUNTER — Ambulatory Visit: Payer: Medicaid Other | Admitting: Adult Health

## 2021-12-10 ENCOUNTER — Other Ambulatory Visit: Payer: Self-pay

## 2021-12-10 ENCOUNTER — Encounter (HOSPITAL_COMMUNITY): Payer: Self-pay | Admitting: *Deleted

## 2021-12-10 DIAGNOSIS — R112 Nausea with vomiting, unspecified: Secondary | ICD-10-CM | POA: Diagnosis not present

## 2021-12-10 DIAGNOSIS — R109 Unspecified abdominal pain: Secondary | ICD-10-CM | POA: Insufficient documentation

## 2021-12-10 DIAGNOSIS — Z5321 Procedure and treatment not carried out due to patient leaving prior to being seen by health care provider: Secondary | ICD-10-CM | POA: Insufficient documentation

## 2021-12-10 LAB — CBC
HCT: 40.1 % (ref 36.0–46.0)
Hemoglobin: 13.2 g/dL (ref 12.0–15.0)
MCH: 28 pg (ref 26.0–34.0)
MCHC: 32.9 g/dL (ref 30.0–36.0)
MCV: 85 fL (ref 80.0–100.0)
Platelets: 209 10*3/uL (ref 150–400)
RBC: 4.72 MIL/uL (ref 3.87–5.11)
RDW: 12.8 % (ref 11.5–15.5)
WBC: 8.2 10*3/uL (ref 4.0–10.5)
nRBC: 0 % (ref 0.0–0.2)

## 2021-12-10 NOTE — ED Triage Notes (Signed)
Pt with abd pain x 2 days with N/V, denies any diarrhea.  LBM couple of days ?

## 2021-12-11 ENCOUNTER — Emergency Department (HOSPITAL_COMMUNITY)
Admission: EM | Admit: 2021-12-11 | Discharge: 2021-12-11 | Discharge status: Against Medical Advice | Payer: Medicaid Other | Attending: Emergency Medicine | Admitting: Emergency Medicine

## 2021-12-11 LAB — COMPREHENSIVE METABOLIC PANEL
ALT: 13 U/L (ref 0–44)
AST: 12 U/L — ABNORMAL LOW (ref 15–41)
Albumin: 4.1 g/dL (ref 3.5–5.0)
Alkaline Phosphatase: 54 U/L (ref 38–126)
Anion gap: 8 (ref 5–15)
BUN: 18 mg/dL (ref 6–20)
CO2: 26 mmol/L (ref 22–32)
Calcium: 8.9 mg/dL (ref 8.9–10.3)
Chloride: 105 mmol/L (ref 98–111)
Creatinine, Ser: 0.54 mg/dL (ref 0.44–1.00)
GFR, Estimated: >60 mL/min (ref >60)
Glucose, Bld: 92 mg/dL (ref 70–99)
Potassium: 3.7 mmol/L (ref 3.5–5.1)
Sodium: 139 mmol/L (ref 135–145)
Total Bilirubin: 0.6 mg/dL (ref 0.3–1.2)
Total Protein: 7.9 g/dL (ref 6.5–8.1)

## 2021-12-11 LAB — HCG, SERUM, QUALITATIVE: Preg, Serum: NEGATIVE

## 2021-12-11 LAB — LIPASE, BLOOD: Lipase: 24 U/L (ref 11–51)

## 2022-04-29 ENCOUNTER — Ambulatory Visit
Admission: EM | Admit: 2022-04-29 | Discharge: 2022-04-29 | Disposition: A | Discharge status: Home / Self Care | Payer: Medicaid Other | Attending: Nurse Practitioner | Admitting: Nurse Practitioner

## 2022-04-29 DIAGNOSIS — J069 Acute upper respiratory infection, unspecified: Secondary | ICD-10-CM | POA: Insufficient documentation

## 2022-04-29 DIAGNOSIS — Z1152 Encounter for screening for COVID-19: Secondary | ICD-10-CM | POA: Diagnosis present

## 2022-04-29 MED ORDER — BENZONATATE 100 MG PO CAPS: 100.0000 mg | ORAL_CAPSULE | Freq: Three times a day (TID) | ORAL | 0 refills | Status: DC | PRN

## 2022-04-29 NOTE — ED Provider Notes (Signed)
[facility]    CSN: [id number] Arrival date & time: 04/29/22  1023      History   Chief Complaint Chief Complaint  Patient presents with   Headache   Cough    HPI Ana Gordon is a 21 y.o. female.   Patient presents with 2 days of tactile fever, nasal congestion, runny nose, postnasal drainage, headache, nausea, and decreased appetite, and fatigue.  Endorses multiple family members with similar symptoms.  No significant cough, shortness of breath, wheezing, chest pain or tightness, chest congestion, sore throat, tooth or ear pain/pressure, abdominal pain, vomiting, diarrhea, and new rash.  Reports she has been exposed to COVID-19 through several family members.  Has not take anything for symptoms so far.    Past Medical History:  Diagnosis Date   Heart murmur     Patient Active Problem List   Diagnosis Date Noted   Precordial chest pain 08/22/2021   SOB (shortness of breath) 08/22/2021    Past Surgical History:  Procedure Laterality Date   NO PAST SURGERIES      OB History     Gravida  2   Para  1   Term  1   Preterm      AB      Living  1      SAB      IAB      Ectopic      Multiple  0   Live Births  1            Home Medications    Prior to Admission medications   Medication Sig Start Date End Date Taking? Authorizing Provider  benzonatate (TESSALON) 100 MG capsule Take 1 capsule (100 mg total) by mouth 3 (three) times daily as needed for cough. Do not take with alcohol or while driving or operating heavy machinery 04/29/22  Yes Cathlean Marseilles A, NP  LO LOESTRIN FE 1 MG-10 MCG / 10 MCG tablet Take 1 tablet by mouth daily. Patient not taking: Reported on 12/06/2019 03/01/19 01/12/21  Cheral Marker, CNM    Family History Family History  Problem Relation Age of Onset   Hypertension Mother    Diabetes Maternal Grandmother    Hypertension Maternal Grandmother    Alzheimer's disease Maternal Grandfather     Hypertension Maternal Aunt    Hypertension Maternal Uncle    Hypertension Cousin     Social History Social History   Tobacco Use   Smoking status: Former    Types: Cigarettes, Cigars   Smokeless tobacco: Never   Tobacco comments:    Quit 3 months ago.    Vaping Use   Vaping Use: Never used  Substance Use Topics   Alcohol use: Not Currently    Comment: occ   Drug use: Not Currently    Types: Marijuana    Comment: 4 days ago     Allergies   Bactrim [sulfamethoxazole-trimethoprim]   Review of Systems Review of Systems Per HPI  Physical Exam Triage Vital Signs ED Triage Vitals  Enc Vitals Group     BP 04/29/22 1132 110/75     Pulse Rate 04/29/22 1132 84     Resp 04/29/22 1132 16     Temp 04/29/22 1132 98.4 F (36.9 C)     Temp Source 04/29/22 1132 Oral     SpO2 04/29/22 1132 97 %     Weight --      Height --      Head Circumference --  Peak Flow --      Pain Score 04/29/22 1133 7     Pain Loc --      Pain Edu? --      Excl. in [place]? --    No data found.  Updated Vital Signs BP 110/75 (BP Location: Left Arm)   Pulse 84   Temp 98.4 F (36.9 C) (Oral)   Resp 16   SpO2 97%   Visual Acuity Right Eye Distance:   Left Eye Distance:   Bilateral Distance:    Right Eye Near:   Left Eye Near:    Bilateral Near:     Physical Exam Vitals and nursing note reviewed.  Constitutional:      General: She is not in acute distress.    Appearance: Normal appearance. She is not ill-appearing or toxic-appearing.  HENT:     Head: Normocephalic and atraumatic.     Right Ear: Tympanic membrane, ear canal and external ear normal.     Left Ear: Tympanic membrane, ear canal and external ear normal.     Nose: Rhinorrhea present. No congestion.     Mouth/Throat:     Mouth: Mucous membranes are moist.     Pharynx: Oropharynx is clear. Posterior oropharyngeal erythema present. No oropharyngeal exudate.  Eyes:     General: No scleral icterus.    Extraocular  Movements: Extraocular movements intact.  Cardiovascular:     Rate and Rhythm: Normal rate and regular rhythm.  Pulmonary:     Effort: Pulmonary effort is normal. No respiratory distress.     Breath sounds: Normal breath sounds. No wheezing, rhonchi or rales.  Abdominal:     General: Abdomen is flat. Bowel sounds are normal. There is no distension.     Palpations: Abdomen is soft.     Tenderness: There is no abdominal tenderness. There is no guarding.  Musculoskeletal:     Cervical back: Normal range of motion and neck supple.  Skin:    General: Skin is warm and dry.     Coloration: Skin is not jaundiced or pale.     Findings: No erythema or rash.  Neurological:     Mental Status: She is alert and oriented to person, place, and time.  Psychiatric:        Behavior: Behavior is cooperative.      UC Treatments / Results  Labs (all labs ordered are listed, but only abnormal results are displayed) Labs Reviewed  SARS CORONAVIRUS 2 (TAT 6-24 HRS)    EKG   Radiology No results found.  Procedures Procedures (including critical care time)  Medications Ordered in UC Medications - No data to display  Initial Impression / Assessment and Plan / UC Course  I have reviewed the triage vital signs and the nursing notes.  Pertinent labs & imaging results that were available during my care of the patient were reviewed by me and considered in my medical decision making (see chart for details).    Patient is well-appearing, normotensive, afebrile, not tachycardic, not tachypneic, oxygenating well on room air.  Discussed that symptoms and examination are consistent with viral upper respiratory infection.  COVID-19 testing obtained.  Supportive care discussed.  Start cough suppressants as needed for cough.  ER precautions and return precautions discussed.  Note given for work. The patient was given the opportunity to ask questions.  All questions answered to their satisfaction.  The patient  is in agreement to this plan.   Final Clinical Impressions(s) / UC Diagnoses  Final diagnoses:  Encounter for screening for COVID-19  Viral URI     Discharge Instructions      Your symptoms and exam findings are most consistent with a viral upper respiratory infection. These usually run their course in about 10 days.  If your symptoms last longer than 10 days without improvement, please follow up with your primary care provider.  If your symptoms, worsen, please go to the Emergency Room.    We have tested you today for COVID-19.  You will see the results in Mychart and we will call you with positive results.    Please stay home and isolate until you are aware of the results.    Some things that can make you feel better are: - Increased rest - Increasing fluid with water/sugar free electrolytes - Acetaminophen and ibuprofen as needed for fever/pain.  - Salt water gargling, chloraseptic spray and throat lozenges - OTC guaifenesin (Mucinex) 600 mg twice daily.  - Saline sinus flushes or a neti pot.  - Humidifying the air. - Tessalon perles as needed for cough.     ED Prescriptions     Medication Sig Dispense Auth. Provider   benzonatate (TESSALON) 100 MG capsule Take 1 capsule (100 mg total) by mouth 3 (three) times daily as needed for cough. Do not take with alcohol or while driving or operating heavy machinery 21 capsule Valentino Nose, NP      PDMP not reviewed this encounter.   Valentino Nose, NP 04/29/22 1302

## 2022-04-29 NOTE — ED Triage Notes (Signed)
Pt present coughing with headache and congestion symptoms started two days ago. Pt states she has been exposed to covid and would like a covid test.

## 2022-04-29 NOTE — Discharge Instructions (Addendum)
Your symptoms and exam findings are most consistent with a viral upper respiratory infection. These usually run their course in about 10 days.  If your symptoms last longer than 10 days without improvement, please follow up with your primary care provider.  If your symptoms, worsen, please go to the Emergency Room.    We have tested you today for COVID-19.  You will see the results in Mychart and we will call you with positive results.    Please stay home and isolate until you are aware of the results.    Some things that can make you feel better are: - Increased rest - Increasing fluid with water/sugar free electrolytes - Acetaminophen and ibuprofen as needed for fever/pain.  - Salt water gargling, chloraseptic spray and throat lozenges - OTC guaifenesin (Mucinex) 600 mg twice daily.  - Saline sinus flushes or a neti pot.  - Humidifying the air. - Tessalon perles as needed for cough.

## 2022-04-30 LAB — SARS CORONAVIRUS 2 (TAT 6-24 HRS): SARS Coronavirus 2: NEGATIVE

## 2022-05-17 ENCOUNTER — Ambulatory Visit
Admission: EM | Admit: 2022-05-17 | Discharge: 2022-05-17 | Disposition: A | Discharge status: Home / Self Care | Payer: Medicaid Other | Attending: Family Medicine | Admitting: Family Medicine

## 2022-05-17 DIAGNOSIS — H9201 Otalgia, right ear: Secondary | ICD-10-CM

## 2022-05-17 DIAGNOSIS — R112 Nausea with vomiting, unspecified: Secondary | ICD-10-CM

## 2022-05-17 MED ORDER — ONDANSETRON 4 MG PO TBDP: 4.0000 mg | ORAL_TABLET | Freq: Three times a day (TID) | ORAL | 0 refills | Status: DC | PRN

## 2022-05-17 NOTE — Discharge Instructions (Signed)
May try Flonase, Sudafed for your right ear pain

## 2022-05-17 NOTE — ED Provider Notes (Signed)
[facility]    CSN: [id number] Arrival date & time: 05/17/22  1531      History   Chief Complaint Chief Complaint  Patient presents with   Otalgia   Nausea    HPI Ana Gordon is a 21 y.o. female.   Patient presenting today with 1 day history of nausea, vomiting after eating some raw food at a restaurant yesterday.  States they tried sending back the food but they had already eaten some of it prior to realizing.  States her son started with symptoms first last night and she took him to the hospital where he was diagnosed with food poisoning.  She has had persistent nausea, vomiting, fatigue since yesterday, no diarrhea or constipation, fevers, chills, body aches.  Tolerating water but nothing else.  Not trying any medication so far.  Also having right ear pain and pressure since this morning.  Denies drainage, upper respiratory symptoms otherwise, decreased hearing.    Past Medical History:  Diagnosis Date   Heart murmur     Patient Active Problem List   Diagnosis Date Noted   Precordial chest pain 08/22/2021   SOB (shortness of breath) 08/22/2021    Past Surgical History:  Procedure Laterality Date   NO PAST SURGERIES      OB History     Gravida  2   Para  1   Term  1   Preterm      AB      Living  1      SAB      IAB      Ectopic      Multiple  0   Live Births  1          Home Medications    Prior to Admission medications   Medication Sig Start Date End Date Taking? Authorizing Provider  ondansetron (ZOFRAN-ODT) 4 MG disintegrating tablet Take 1 tablet (4 mg total) by mouth every 8 (eight) hours as needed for nausea or vomiting. 05/17/22  Yes Particia Nearing, PA-C  benzonatate (TESSALON) 100 MG capsule Take 1 capsule (100 mg total) by mouth 3 (three) times daily as needed for cough. Do not take with alcohol or while driving or operating heavy machinery 04/29/22   Cathlean Marseilles A, NP  LO LOESTRIN FE 1 MG-10 MCG / 10  MCG tablet Take 1 tablet by mouth daily. Patient not taking: Reported on 12/06/2019 03/01/19 01/12/21  Cheral Marker, CNM    Family History Family History  Problem Relation Age of Onset   Hypertension Mother    Diabetes Maternal Grandmother    Hypertension Maternal Grandmother    Alzheimer's disease Maternal Grandfather    Hypertension Maternal Aunt    Hypertension Maternal Uncle    Hypertension Cousin     Social History Social History   Tobacco Use   Smoking status: Former    Types: Cigarettes, Cigars   Smokeless tobacco: Never   Tobacco comments:    Quit 3 months ago.    Vaping Use   Vaping Use: Never used  Substance Use Topics   Alcohol use: Not Currently    Comment: occ   Drug use: Not Currently    Types: Marijuana    Comment: 4 days ago     Allergies   Bactrim [sulfamethoxazole-trimethoprim]   Review of Systems Review of Systems PER HPI  Physical Exam Triage Vital Signs ED Triage Vitals  Enc Vitals Group     BP 05/17/22 1538 117/74  Pulse Rate 05/17/22 1538 90     Resp 05/17/22 1538 18     Temp 05/17/22 1538 97.9 F (36.6 C)     Temp Source 05/17/22 1538 Oral     SpO2 05/17/22 1538 98 %     Weight --      Height --      Head Circumference --      Peak Flow --      Pain Score 05/17/22 1540 8     Pain Loc --      Pain Edu? --      Excl. in [place]? --    No data found.  Updated Vital Signs BP 117/74 (BP Location: Right Arm)   Pulse 90   Temp 97.9 F (36.6 C) (Oral)   Resp 18   LMP  (Within Months) Comment: 1 month  SpO2 98%   Visual Acuity Right Eye Distance:   Left Eye Distance:   Bilateral Distance:    Right Eye Near:   Left Eye Near:    Bilateral Near:     Physical Exam Vitals and nursing note reviewed.  Constitutional:      Appearance: Normal appearance. She is not ill-appearing.  HENT:     Head: Atraumatic.     Ears:     Comments: Right middle ear effusion    Mouth/Throat:     Mouth: Mucous membranes are moist.   Eyes:     Extraocular Movements: Extraocular movements intact.     Conjunctiva/sclera: Conjunctivae normal.  Cardiovascular:     Rate and Rhythm: Normal rate and regular rhythm.     Heart sounds: Normal heart sounds.  Pulmonary:     Effort: Pulmonary effort is normal.     Breath sounds: Normal breath sounds.  Abdominal:     General: Bowel sounds are normal. There is no distension.     Palpations: Abdomen is soft.     Tenderness: There is no abdominal tenderness. There is no right CVA tenderness, left CVA tenderness or guarding.  Musculoskeletal:        General: Normal range of motion.     Cervical back: Normal range of motion and neck supple.  Skin:    General: Skin is warm and dry.  Neurological:     Mental Status: She is alert and oriented to person, place, and time.  Psychiatric:        Mood and Affect: Mood normal.        Thought Content: Thought content normal.        Judgment: Judgment normal.    UC Treatments / Results  Labs (all labs ordered are listed, but only abnormal results are displayed) Labs Reviewed - No data to display  EKG  Radiology No results found.  Procedures Procedures (including critical care time)  Medications Ordered in UC Medications - No data to display  Initial Impression / Assessment and Plan / UC Course  I have reviewed the triage vital signs and the nursing notes.  Pertinent labs & imaging results that were available during my care of the patient were reviewed by me and considered in my medical decision making (see chart for details).     Right middle ear effusion, discussed Sudafed, Flonase and over-the-counter pain relievers for this.  Regarding her nausea and vomiting, Zofran sent for as needed use, brat diet, fluids.  Suspect is related to the food from the restaurant.  Work note given.  Return for worsening symptoms.  Vitals and exam overall reassuring  with no red flag findings.  Final Clinical Impressions(s) / UC Diagnoses    Final diagnoses:  Nausea and vomiting, unspecified vomiting type  Right ear pain     Discharge Instructions      May try Flonase, Sudafed for your right ear pain    ED Prescriptions     Medication Sig Dispense Auth. Provider   ondansetron (ZOFRAN-ODT) 4 MG disintegrating tablet Take 1 tablet (4 mg total) by mouth every 8 (eight) hours as needed for nausea or vomiting. 20 tablet Volney American, [place]      PDMP not reviewed this encounter.   Volney American, [place] 05/17/22 1558

## 2022-05-17 NOTE — ED Triage Notes (Signed)
Pt reports nausea, vomiting x 1 day after eating raw food at a restaurant.   Pt right ear pain since this morning.

## 2022-05-20 ENCOUNTER — Other Ambulatory Visit: Payer: Self-pay

## 2022-05-20 ENCOUNTER — Emergency Department (HOSPITAL_COMMUNITY)
Admission: EM | Admit: 2022-05-20 | Discharge: 2022-05-21 | Disposition: A | Discharge status: Home / Self Care | Payer: Medicaid Other | Attending: Emergency Medicine | Admitting: Emergency Medicine

## 2022-05-20 ENCOUNTER — Encounter (HOSPITAL_COMMUNITY): Payer: Self-pay | Admitting: Emergency Medicine

## 2022-05-20 DIAGNOSIS — H9201 Otalgia, right ear: Secondary | ICD-10-CM | POA: Insufficient documentation

## 2022-05-20 DIAGNOSIS — J069 Acute upper respiratory infection, unspecified: Secondary | ICD-10-CM | POA: Diagnosis not present

## 2022-05-20 DIAGNOSIS — R519 Headache, unspecified: Secondary | ICD-10-CM | POA: Diagnosis present

## 2022-05-20 NOTE — ED Triage Notes (Signed)
  Patient comes in with R ear pain that has been going on for about a week.  Patient states she was told at [facility] she had fluid in her ear, and it has been causing her headaches.  Pain 8/10, pressure in head/face.    Patient also states she is a week late for her menstrual cycle and is concerned she may be pregnant.  Patient states she took an at home pregnancy test and it was negative but she is normally on time with her menses.

## 2022-05-21 LAB — POC URINE PREG, ED: Preg Test, Ur: NEGATIVE

## 2022-05-21 MED ORDER — FEXOFENADINE-PSEUDOEPHED ER 60-120 MG PO TB12: 1.0000 | ORAL_TABLET | Freq: Two times a day (BID) | ORAL | 0 refills | Status: AC

## 2022-05-21 MED ORDER — FLUTICASONE PROPIONATE 50 MCG/ACT NA SUSP: 1.0000 | Freq: Every day | NASAL | 2 refills | Status: AC

## 2022-05-21 MED ORDER — PREDNISONE 20 MG PO TABS: 40.0000 mg | ORAL_TABLET | Freq: Every day | ORAL | 0 refills | Status: DC

## 2022-05-21 NOTE — ED Provider Notes (Signed)
[facility] EMERGENCY DEPARTMENT Provider Note   CSN: [id number] Arrival date & time: 05/20/22  2257     History  Chief Complaint  Patient presents with   Headache   Possible Pregnancy    Ana Gordon is a 21 y.o. female.  Patient presents to the emergency department for evaluation of right ear pain, sinus congestion, nasal congestion and headache.  Symptoms ongoing nearly a week.  Patient was seen in urgent care was told she had fluid behind her ears.  She is also concerned because she is late for her period.  She reports that she is normally very regular.       Home Medications Prior to Admission medications   Medication Sig Start Date End Date Taking? Authorizing Provider  fexofenadine-pseudoephedrine (ALLEGRA-D) 60-120 MG 12 hr tablet Take 1 tablet by mouth every 12 (twelve) hours. 05/21/22  Yes Tiaunna Buford, Canary Brim, MD  fluticasone (FLONASE) 50 MCG/ACT nasal spray Place 1 spray into both nostrils daily. 05/21/22  Yes Lynden Flemmer, Canary Brim, MD  predniSONE (DELTASONE) 20 MG tablet Take 2 tablets (40 mg total) by mouth daily with breakfast. 05/21/22  Yes Brittie Whisnant, Canary Brim, MD  benzonatate (TESSALON) 100 MG capsule Take 1 capsule (100 mg total) by mouth 3 (three) times daily as needed for cough. Do not take with alcohol or while driving or operating heavy machinery 04/29/22   Valentino Nose, NP  ondansetron (ZOFRAN-ODT) 4 MG disintegrating tablet Take 1 tablet (4 mg total) by mouth every 8 (eight) hours as needed for nausea or vomiting. 05/17/22   Particia Nearing, PA-C  LO LOESTRIN FE 1 MG-10 MCG / 10 MCG tablet Take 1 tablet by mouth daily. Patient not taking: Reported on 12/06/2019 03/01/19 01/12/21  Cheral Marker, CNM      Allergies    Bactrim [sulfamethoxazole-trimethoprim]    Review of Systems   Review of Systems  Physical Exam Updated Vital Signs BP 120/86 (BP Location: Right Arm)   Pulse 80   Temp 98.6 F (37 C) (Oral)   Resp 18   Ht 5'  4" (1.626 m)   Wt 77.1 kg   LMP 04/13/2022 (Approximate)   SpO2 97%   BMI 29.18 kg/m  Physical Exam Vitals and nursing note reviewed.  Constitutional:      General: She is not in acute distress.    Appearance: She is well-developed.  HENT:     Head: Normocephalic and atraumatic.     Right Ear: Tympanic membrane and ear canal normal.     Left Ear: Tympanic membrane and ear canal normal.     Nose:     Right Sinus: Maxillary sinus tenderness and frontal sinus tenderness present.     Left Sinus: Maxillary sinus tenderness and frontal sinus tenderness present.     Mouth/Throat:     Mouth: Mucous membranes are moist.  Eyes:     General: Vision grossly intact. Gaze aligned appropriately.     Extraocular Movements: Extraocular movements intact.     Conjunctiva/sclera: Conjunctivae normal.  Cardiovascular:     Rate and Rhythm: Normal rate and regular rhythm.     Pulses: Normal pulses.     Heart sounds: Normal heart sounds, S1 normal and S2 normal. No murmur heard.    No friction rub. No gallop.  Pulmonary:     Effort: Pulmonary effort is normal. No respiratory distress.     Breath sounds: Normal breath sounds.  Abdominal:     General: Bowel sounds are normal.  Palpations: Abdomen is soft.     Tenderness: There is no abdominal tenderness. There is no guarding or rebound.     Hernia: No hernia is present.  Musculoskeletal:        General: No swelling.     Cervical back: Full passive range of motion without pain, normal range of motion and neck supple. No spinous process tenderness or muscular tenderness. Normal range of motion.     Right lower leg: No edema.     Left lower leg: No edema.  Skin:    General: Skin is warm and dry.     Capillary Refill: Capillary refill takes less than 2 seconds.     Findings: No ecchymosis, erythema, rash or wound.  Neurological:     General: No focal deficit present.     Mental Status: She is alert and oriented to person, place, and time.      GCS: GCS eye subscore is 4. GCS verbal subscore is 5. GCS motor subscore is 6.     Cranial Nerves: Cranial nerves 2-12 are intact.     Sensory: Sensation is intact.     Motor: Motor function is intact.     Coordination: Coordination is intact.  Psychiatric:        Attention and Perception: Attention normal.        Mood and Affect: Mood normal.        Speech: Speech normal.        Behavior: Behavior normal.     ED Results / Procedures / Treatments   Labs (all labs ordered are listed, but only abnormal results are displayed) Labs Reviewed  POC URINE PREG, ED    EKG None  Radiology No results found.  Procedures Procedures    Medications Ordered in ED Medications - No data to display  ED Course/ Medical Decision Making/ A&P                           Medical Decision Making  Presents with URI symptoms.  Patient complaining of headache and facial pain, mostly in the area of her sinuses.        Final Clinical Impression(s) / ED Diagnoses Final diagnoses:  Upper respiratory tract infection, unspecified type    Rx / DC Orders ED Discharge Orders          Ordered    fluticasone (FLONASE) 50 MCG/ACT nasal spray  Daily        05/21/22 0013    predniSONE (DELTASONE) 20 MG tablet  Daily with breakfast        05/21/22 0013    fexofenadine-pseudoephedrine (ALLEGRA-D) 60-120 MG 12 hr tablet  Every 12 hours        05/21/22 0013              Orpah Greek, MD 05/21/22 [phone]

## 2022-05-21 NOTE — Discharge Instructions (Addendum)
Your pregnancy test was negative.  It is unclear why you are late for your period.  You need to follow-up with OB/GYN for further evaluation.

## 2022-05-29 ENCOUNTER — Ambulatory Visit
Admission: EM | Admit: 2022-05-29 | Discharge: 2022-05-29 | Disposition: A | Discharge status: Home / Self Care | Payer: Medicaid Other | Attending: Nurse Practitioner | Admitting: Nurse Practitioner

## 2022-05-29 DIAGNOSIS — J029 Acute pharyngitis, unspecified: Secondary | ICD-10-CM | POA: Diagnosis present

## 2022-05-29 DIAGNOSIS — Z1152 Encounter for screening for COVID-19: Secondary | ICD-10-CM | POA: Insufficient documentation

## 2022-05-29 DIAGNOSIS — J069 Acute upper respiratory infection, unspecified: Secondary | ICD-10-CM | POA: Insufficient documentation

## 2022-05-29 LAB — RESP PANEL BY RT-PCR (RSV, FLU A&B, COVID)  RVPGX2
Influenza A by PCR: NEGATIVE
Influenza B by PCR: NEGATIVE
Resp Syncytial Virus by PCR: NEGATIVE
SARS Coronavirus 2 by RT PCR: NEGATIVE

## 2022-05-29 LAB — POCT RAPID STREP A (OFFICE): Rapid Strep A Screen: NEGATIVE

## 2022-05-29 MED ORDER — BENZONATATE 100 MG PO CAPS: 100.0000 mg | ORAL_CAPSULE | Freq: Three times a day (TID) | ORAL | 0 refills | Status: AC | PRN

## 2022-05-29 NOTE — ED Triage Notes (Signed)
Pt presents with complaints of fever (102), sore throat, and headache that started 2 days ago. Denies fever today.

## 2022-05-29 NOTE — Discharge Instructions (Signed)
Most likely have a viral sore throat.  This should improve over the next week to 10 days or so.  We have tested you for strep throat, rapid test is negative and throat culture pending today.  As well as COVID-19 and influenza.  We will call you with any positive results.  Please stay home and isolate until you are aware of the results.  If you develop chest pain or shortness of breath, go to emergency room.  Some things that can make you feel better are: - Increased rest - Increasing fluid with water/sugar free electrolytes - Acetaminophen and ibuprofen as needed for fever/pain.  - Salt water gargling, chloraseptic spray and throat lozenges - OTC guaifenesin (Mucinex) 600 mg twice daily.  - Saline sinus flushes or a neti pot.  - Humidifying the air. -Tessalon Perles as needed for dry cough

## 2022-05-29 NOTE — ED Provider Notes (Signed)
[facility]    CSN: [id number] Arrival date & time: 05/29/22  0350      History   Chief Complaint Chief Complaint  Patient presents with   Sore Throat    HPI Ana Gordon is a 21 y.o. female.   Patient presents for 2 days of sore throat, 102 degree fever at home, congested and dry cough, chest and nasal congestion, 1 episode of chest tightness after coughing, postnasal drainage, sore throat, headache, and fatigue.  She denies chest pain, shortness of breath, wheezing, runny nose, sneezing, ear pain or drainage, abdominal pain, nausea/vomiting, diarrhea, and decreased appetite.  No new rash or known sick contacts.  Has taken DayQuil and NyQuil for symptoms without much relief.  Patient reports she was seen about 10 days ago for an upper respiratory infection the emergency room.  Her symptoms fully improved after the last illness.    Past Medical History:  Diagnosis Date   Heart murmur     Patient Active Problem List   Diagnosis Date Noted   Precordial chest pain 08/22/2021   SOB (shortness of breath) 08/22/2021    Past Surgical History:  Procedure Laterality Date   NO PAST SURGERIES      OB History     Gravida  2   Para  1   Term  1   Preterm      AB      Living  1      SAB      IAB      Ectopic      Multiple  0   Live Births  1            Home Medications    Prior to Admission medications   Medication Sig Start Date End Date Taking? Authorizing Provider  benzonatate (TESSALON) 100 MG capsule Take 1 capsule (100 mg total) by mouth 3 (three) times daily as needed for cough. Do not take with alcohol or while driving or operating heavy machinery.  May cause drowsiness. 05/29/22   Eulogio Bear, NP  fexofenadine-pseudoephedrine (ALLEGRA-D) 60-120 MG 12 hr tablet Take 1 tablet by mouth every 12 (twelve) hours. 05/21/22   Orpah Greek, MD  fluticasone (FLONASE) 50 MCG/ACT nasal spray Place 1 spray into both  nostrils daily. 05/21/22   Orpah Greek, MD  ondansetron (ZOFRAN-ODT) 4 MG disintegrating tablet Take 1 tablet (4 mg total) by mouth every 8 (eight) hours as needed for nausea or vomiting. 05/17/22   Volney American, PA-C  LO LOESTRIN FE 1 MG-10 MCG / 10 MCG tablet Take 1 tablet by mouth daily. Patient not taking: Reported on 12/06/2019 03/01/19 01/12/21  Roma Schanz, CNM    Family History Family History  Problem Relation Age of Onset   Hypertension Mother    Diabetes Maternal Grandmother    Hypertension Maternal Grandmother    Alzheimer's disease Maternal Grandfather    Hypertension Maternal Aunt    Hypertension Maternal Uncle    Hypertension Cousin     Social History Social History   Tobacco Use   Smoking status: Former    Types: Cigarettes, Cigars   Smokeless tobacco: Never   Tobacco comments:    Quit 3 months ago.    Vaping Use   Vaping Use: Never used  Substance Use Topics   Alcohol use: Not Currently    Comment: occ   Drug use: Not Currently    Types: Marijuana    Comment: 4 days ago  Allergies   Bactrim [sulfamethoxazole-trimethoprim]   Review of Systems Review of Systems Per HPI  Physical Exam Triage Vital Signs ED Triage Vitals  Enc Vitals Group     BP 05/29/22 1015 132/83     Pulse Rate 05/29/22 1015 88     Resp 05/29/22 1015 17     Temp 05/29/22 1015 98.7 F (37.1 C)     Temp src --      SpO2 05/29/22 1015 96 %     Weight --      Height --      Head Circumference --      Peak Flow --      Pain Score 05/29/22 1013 4     Pain Loc --      Pain Edu? --      Excl. in GC? --    No data found.  Updated Vital Signs BP 132/83   Pulse 88   Temp 98.7 F (37.1 C)   Resp 17   LMP 05/25/2022 (Approximate)   SpO2 96%   Visual Acuity Right Eye Distance:   Left Eye Distance:   Bilateral Distance:    Right Eye Near:   Left Eye Near:    Bilateral Near:     Physical Exam Vitals and nursing note reviewed.   Constitutional:      General: She is not in acute distress.    Appearance: Normal appearance. She is not ill-appearing or toxic-appearing.  HENT:     Head: Normocephalic and atraumatic.     Right Ear: Ear canal and external ear normal. A middle ear effusion is present.     Left Ear: Ear canal and external ear normal. A middle ear effusion is present.     Nose: Rhinorrhea present. No congestion.     Mouth/Throat:     Mouth: Mucous membranes are moist.     Pharynx: Oropharynx is clear. Posterior oropharyngeal erythema present. No oropharyngeal exudate.     Tonsils: No tonsillar exudate. 1+ on the right. 1+ on the left.  Eyes:     General: No scleral icterus.    Extraocular Movements: Extraocular movements intact.  Cardiovascular:     Rate and Rhythm: Normal rate and regular rhythm.  Pulmonary:     Effort: Pulmonary effort is normal. No respiratory distress.     Breath sounds: Normal breath sounds. No wheezing, rhonchi or rales.  Abdominal:     General: Abdomen is flat. Bowel sounds are normal. There is no distension.     Palpations: Abdomen is soft.     Tenderness: There is no abdominal tenderness.  Musculoskeletal:     Cervical back: Normal range of motion and neck supple.  Lymphadenopathy:     Cervical: No cervical adenopathy.  Skin:    General: Skin is warm and dry.     Coloration: Skin is not jaundiced or pale.     Findings: No erythema or rash.  Neurological:     Mental Status: She is alert and oriented to person, place, and time.  Psychiatric:        Behavior: Behavior is cooperative.      UC Treatments / Results  Labs (all labs ordered are listed, but only abnormal results are displayed) Labs Reviewed  RESP PANEL BY RT-PCR (RSV, FLU A&B, COVID)  RVPGX2  CULTURE, GROUP A STREP [facility])  POCT RAPID STREP A (OFFICE)    EKG   Radiology No results found.  Procedures Procedures (including critical care time)  Medications Ordered  in UC Medications - No data to  display  Initial Impression / Assessment and Plan / UC Course  I have reviewed the triage vital signs and the nursing notes.  Pertinent labs & imaging results that were available during my care of the patient were reviewed by me and considered in my medical decision making (see chart for details).    Patient is well-appearing, normotensive, afebrile, not tachycardic, not tachypneic, oxygenating well on room air.    Encounter for screening for COVID-19 Viral URI with cough COVID-19, influenza, RSV testing obtained as she has a young toddler. Supportive care discussed. Start cough suppressants as needed. ER and return precautions discussed. Note given for work.   Acute pharyngitis, unspecified etiology Rapid strep throat test negative, throat culture pending. Treat with antibiotics only if throat culture positive. Supportive care discussed.  The patient was given the opportunity to ask questions.  All questions answered to their satisfaction.  The patient is in agreement to this plan.    Final Clinical Impressions(s) / UC Diagnoses   Final diagnoses:  Encounter for screening for COVID-19  Viral URI with cough  Acute pharyngitis, unspecified etiology     Discharge Instructions      Most likely have a viral sore throat.  This should improve over the next week to 10 days or so.  We have tested you for strep throat, rapid test is negative and throat culture pending today.  As well as COVID-19 and influenza.  We will call you with any positive results.  Please stay home and isolate until you are aware of the results.  If you develop chest pain or shortness of breath, go to emergency room.  Some things that can make you feel better are: - Increased rest - Increasing fluid with water/sugar free electrolytes - Acetaminophen and ibuprofen as needed for fever/pain.  - Salt water gargling, chloraseptic spray and throat lozenges - OTC guaifenesin (Mucinex) 600 mg twice daily.  - Saline  sinus flushes or a neti pot.  - Humidifying the air. -Tessalon Perles as needed for dry cough     ED Prescriptions     Medication Sig Dispense Auth. Provider   benzonatate (TESSALON) 100 MG capsule Take 1 capsule (100 mg total) by mouth 3 (three) times daily as needed for cough. Do not take with alcohol or while driving or operating heavy machinery.  May cause drowsiness. 21 capsule Valentino Nose, NP      PDMP not reviewed this encounter.   Valentino Nose, NP 05/29/22 1059

## 2022-06-01 LAB — CULTURE, GROUP A STREP (THRC)

## 2022-07-12 ENCOUNTER — Emergency Department (HOSPITAL_COMMUNITY)
Admission: EM | Admit: 2022-07-12 | Discharge: 2022-07-12 | Discharge status: Against Medical Advice | Payer: Medicaid Other | Source: Home / Self Care

## 2022-08-02 ENCOUNTER — Emergency Department (HOSPITAL_COMMUNITY)
Admission: EM | Admit: 2022-08-02 | Discharge: 2022-08-02 | Disposition: A | Discharge status: Home / Self Care | Payer: Medicaid Other | Attending: Emergency Medicine | Admitting: Emergency Medicine

## 2022-08-02 ENCOUNTER — Encounter (HOSPITAL_COMMUNITY): Payer: Self-pay

## 2022-08-02 ENCOUNTER — Emergency Department (HOSPITAL_COMMUNITY): Payer: Medicaid Other

## 2022-08-02 ENCOUNTER — Other Ambulatory Visit: Payer: Self-pay

## 2022-08-02 DIAGNOSIS — R109 Unspecified abdominal pain: Secondary | ICD-10-CM | POA: Diagnosis present

## 2022-08-02 DIAGNOSIS — K8020 Calculus of gallbladder without cholecystitis without obstruction: Secondary | ICD-10-CM | POA: Insufficient documentation

## 2022-08-02 DIAGNOSIS — Z20822 Contact with and (suspected) exposure to covid-19: Secondary | ICD-10-CM | POA: Diagnosis not present

## 2022-08-02 LAB — CBC
HCT: 37.2 % (ref 36.0–46.0)
Hemoglobin: 12.1 g/dL (ref 12.0–15.0)
MCH: 27.2 pg (ref 26.0–34.0)
MCHC: 32.5 g/dL (ref 30.0–36.0)
MCV: 83.6 fL (ref 80.0–100.0)
Platelets: 232 K/uL (ref 150–400)
RBC: 4.45 MIL/uL (ref 3.87–5.11)
RDW: 12.8 % (ref 11.5–15.5)
WBC: 7.7 K/uL (ref 4.0–10.5)
nRBC: 0 % (ref 0.0–0.2)

## 2022-08-02 LAB — COMPREHENSIVE METABOLIC PANEL WITH GFR
ALT: 17 U/L (ref 0–44)
AST: 14 U/L — ABNORMAL LOW (ref 15–41)
Albumin: 3.9 g/dL (ref 3.5–5.0)
Alkaline Phosphatase: 76 U/L (ref 38–126)
Anion gap: 7 (ref 5–15)
BUN: 15 mg/dL (ref 6–20)
CO2: 24 mmol/L (ref 22–32)
Calcium: 8.7 mg/dL — ABNORMAL LOW (ref 8.9–10.3)
Chloride: 106 mmol/L (ref 98–111)
Creatinine, Ser: 0.79 mg/dL (ref 0.44–1.00)
GFR, Estimated: >60 mL/min
Glucose, Bld: 115 mg/dL — ABNORMAL HIGH (ref 70–99)
Potassium: 3.7 mmol/L (ref 3.5–5.1)
Sodium: 137 mmol/L (ref 135–145)
Total Bilirubin: 0.7 mg/dL (ref 0.3–1.2)
Total Protein: 7.2 g/dL (ref 6.5–8.1)

## 2022-08-02 LAB — RESP PANEL BY RT-PCR (RSV, FLU A&B, COVID)  RVPGX2
Influenza A by PCR: NEGATIVE
Influenza B by PCR: NEGATIVE
Resp Syncytial Virus by PCR: NEGATIVE
SARS Coronavirus 2 by RT PCR: NEGATIVE

## 2022-08-02 LAB — URINALYSIS, ROUTINE W REFLEX MICROSCOPIC
Bilirubin Urine: NEGATIVE
Glucose, UA: NEGATIVE mg/dL
Hgb urine dipstick: NEGATIVE
Ketones, ur: NEGATIVE mg/dL
Leukocytes,Ua: NEGATIVE
Nitrite: NEGATIVE
Protein, ur: NEGATIVE mg/dL
Specific Gravity, Urine: 1.028 (ref 1.005–1.030)
pH: 6 (ref 5.0–8.0)

## 2022-08-02 LAB — LIPASE, BLOOD: Lipase: 27 U/L (ref 11–51)

## 2022-08-02 LAB — PREGNANCY, URINE: Preg Test, Ur: NEGATIVE

## 2022-08-02 MED ORDER — FAMOTIDINE IN NACL 20-0.9 MG/50ML-% IV SOLN
20.0000 mg | Freq: Once | INTRAVENOUS | Status: AC
  Administered 2022-08-02: 20 mg via INTRAVENOUS
  Filled 2022-08-02: qty 50

## 2022-08-02 MED ORDER — ONDANSETRON 4 MG PO TBDP: 4.0000 mg | ORAL_TABLET | Freq: Three times a day (TID) | ORAL | 0 refills | Status: AC | PRN

## 2022-08-02 MED ORDER — HYDROCODONE-ACETAMINOPHEN 5-325 MG PO TABS: 1.0000 | ORAL_TABLET | ORAL | 0 refills | Status: AC | PRN

## 2022-08-02 MED ORDER — MORPHINE SULFATE (PF) 4 MG/ML IV SOLN
4.0000 mg | Freq: Once | INTRAVENOUS | Status: AC
  Administered 2022-08-02: 4 mg via INTRAVENOUS
  Filled 2022-08-02: qty 1

## 2022-08-02 MED ORDER — SODIUM CHLORIDE 0.9 % IV BOLUS
1000.0000 mL | Freq: Once | INTRAVENOUS | Status: AC
  Administered 2022-08-02: 1000 mL via INTRAVENOUS

## 2022-08-02 MED ORDER — IOHEXOL 350 MG/ML SOLN
100.0000 mL | Freq: Once | INTRAVENOUS | Status: AC | PRN
  Administered 2022-08-02: 100 mL via INTRAVENOUS

## 2022-08-02 MED ORDER — ONDANSETRON HCL 4 MG/2ML IJ SOLN
4.0000 mg | Freq: Once | INTRAMUSCULAR | Status: AC
  Administered 2022-08-02: 4 mg via INTRAVENOUS
  Filled 2022-08-02: qty 2

## 2022-08-02 NOTE — ED Triage Notes (Signed)
Pt c/o R side pain to chest and abd. Pt also states intermittent chest "burning" and ShOB. Pt states she has previously been told she had a "sinus arrhythmia." Pt states symptoms started when she woke up 2 days ago. Pt reports associated N/V and constipation. Last BM 4 days prior.

## 2022-08-02 NOTE — Discharge Instructions (Signed)
Avoid fatty and greasy foods. 

## 2022-08-03 NOTE — ED Provider Notes (Signed)
[facility] Provider Note   CSN: [id number] Arrival date & time: 08/02/22  1958     History  Chief Complaint  Patient presents with   Abdominal Pain   Chest Pain    Ana StainDesiree Mundorf is a 21 y.o. female.  Pt is a 21 yo female with a pmhx significant for gallstones.  She comes to the ED today for cp and abd pain.  Sx started 2 days ago.  Pt was seen in the ED in January and was diagnosed with gallstones.  However, she did not follow up with surgery.         Home Medications Prior to Admission medications   Medication Sig Start Date End Date Taking? Authorizing Provider  HYDROcodone-acetaminophen (NORCO/VICODIN) 5-325 MG tablet Take 1 tablet by mouth every 4 (four) hours as needed. 08/02/22  Yes Jacalyn LefevreHaviland, Lucina Betty, MD  ondansetron (ZOFRAN-ODT) 4 MG disintegrating tablet Take 1 tablet (4 mg total) by mouth every 8 (eight) hours as needed. 08/02/22  Yes Jacalyn LefevreHaviland, Perle Brickhouse, MD  benzonatate (TESSALON) 100 MG capsule Take 1 capsule (100 mg total) by mouth 3 (three) times daily as needed for cough. Do not take with alcohol or while driving or operating heavy machinery.  May cause drowsiness. 05/29/22   Valentino NoseMartinez, Jessica A, NP  fexofenadine-pseudoephedrine (ALLEGRA-D) 60-120 MG 12 hr tablet Take 1 tablet by mouth every 12 (twelve) hours. 05/21/22   Gilda CreasePollina, Christopher J, MD  fluticasone (FLONASE) 50 MCG/ACT nasal spray Place 1 spray into both nostrils daily. 05/21/22   Pollina, Canary Brimhristopher J, MD  LO LOESTRIN FE 1 MG-10 MCG / 10 MCG tablet Take 1 tablet by mouth daily. Patient not taking: Reported on 12/06/2019 03/01/19 01/12/21  Cheral MarkerBooker, Kimberly R, CNM      Allergies    Bactrim [sulfamethoxazole-trimethoprim]    Review of Systems   Review of Systems  Cardiovascular:  Positive for chest pain.  Gastrointestinal:  Positive for abdominal pain and nausea.  All other systems reviewed and are negative.   Physical Exam Updated Vital Signs BP 121/73   Pulse 79   Temp 97.8  F (36.6 C) (Oral)   Resp 15   Ht 5\' 3"  (1.6 m)   Wt 76.2 kg   LMP 07/18/2022   SpO2 99%   BMI 29.76 kg/m  Physical Exam Vitals and nursing note reviewed.  Constitutional:      Appearance: She is well-developed.  HENT:     Head: Normocephalic and atraumatic.     Mouth/Throat:     Mouth: Mucous membranes are moist.     Pharynx: Oropharynx is clear.  Eyes:     Extraocular Movements: Extraocular movements intact.     Pupils: Pupils are equal, round, and reactive to light.  Cardiovascular:     Rate and Rhythm: Normal rate and regular rhythm.  Pulmonary:     Effort: Pulmonary effort is normal.     Breath sounds: Normal breath sounds.  Abdominal:     General: Abdomen is flat. Bowel sounds are normal.     Palpations: Abdomen is soft.     Tenderness: There is abdominal tenderness in the right upper quadrant and epigastric area.  Skin:    General: Skin is warm.     Capillary Refill: Capillary refill takes less than 2 seconds.  Neurological:     General: No focal deficit present.     Mental Status: She is alert and oriented to person, place, and time.  Psychiatric:        Mood  and Affect: Mood normal.        Behavior: Behavior normal.     ED Results / Procedures / Treatments   Labs (all labs ordered are listed, but only abnormal results are displayed) Labs Reviewed  COMPREHENSIVE METABOLIC PANEL - Abnormal; Notable for the following components:      Result Value   Glucose, Bld 115 (*)    Calcium 8.7 (*)    AST 14 (*)    All other components within normal limits  URINALYSIS, ROUTINE W REFLEX MICROSCOPIC - Abnormal; Notable for the following components:   APPearance HAZY (*)    All other components within normal limits  RESP PANEL BY RT-PCR (RSV, FLU A&B, COVID)  RVPGX2  LIPASE, BLOOD  CBC  PREGNANCY, URINE    EKG EKG Interpretation  Date/Time:  Saturday August 02 2022 20:21:16 EST Ventricular Rate:  93 PR Interval:  134 QRS Duration: 92 QT  Interval:  360 QTC Calculation: 447 R Axis:   65 Text Interpretation: Normal sinus rhythm Normal ECG When compared with ECG of 31-Jul-2021 17:01, No significant change was found No significant change since last tracing Confirmed by Jacalyn Lefevre [phone]) on 08/02/2022 9:36:56 PM  Radiology CT Angio Chest PE W and/or Wo Contrast  Result Date: 08/02/2022 CLINICAL DATA:  Right-sided chest pain abdomen pain EXAM: CT ANGIOGRAPHY CHEST CT ABDOMEN AND PELVIS WITH CONTRAST TECHNIQUE: Multidetector CT imaging of the chest was performed using the standard protocol during bolus administration of intravenous contrast. Multiplanar CT image reconstructions and MIPs were obtained to evaluate the vascular anatomy. Multidetector CT imaging of the abdomen and pelvis was performed using the standard protocol during bolus administration of intravenous contrast. RADIATION DOSE REDUCTION: This exam was performed according to the departmental dose-optimization program which includes automated exposure control, adjustment of the mA and/or kV according to patient size and/or use of iterative reconstruction technique. CONTRAST:  OMNIPAQUE IOHEXOL 350 MG/ML SOLN COMPARISON:  CT 08/26/2021, CT chest 08/01/2021 FINDINGS: CTA CHEST FINDINGS Cardiovascular: Satisfactory opacification of the pulmonary arteries to the segmental level. No evidence of pulmonary embolism. Normal heart size. No pericardial effusion. Nonaneurysmal aorta. No dissection is seen. Mediastinum/Nodes: No enlarged mediastinal, hilar, or axillary lymph nodes. Thyroid gland, trachea, and esophagus demonstrate no significant findings. Lungs/Pleura: Lungs are clear. No pleural effusion or pneumothorax. Musculoskeletal: No chest wall abnormality. No acute or significant osseous findings. Review of the MIP images confirms the above findings. CT ABDOMEN and PELVIS FINDINGS Hepatobiliary: No focal liver abnormality is seen. Gallstones. No biliary dilatation. Pancreas:  Unremarkable. No pancreatic ductal dilatation or surrounding inflammatory changes. Spleen: Normal in size without focal abnormality. Adrenals/Urinary Tract: Adrenal glands are unremarkable. Kidneys are normal, without renal calculi, focal lesion, or hydronephrosis. Bladder is unremarkable. Stomach/Bowel: Stomach is within normal limits. Appendix appears normal. No evidence of bowel wall thickening, distention, or inflammatory changes. Vascular/Lymphatic: No significant vascular findings are present. No enlarged abdominal or pelvic lymph nodes. Reproductive: Uterus and bilateral adnexa are unremarkable. Other: No abdominal wall hernia or abnormality. No abdominopelvic ascites. Musculoskeletal: No acute or significant osseous findings. Review of the MIP images confirms the above findings. IMPRESSION: 1. Negative for acute pulmonary embolus or aortic dissection. Clear lung fields. 2. No CT evidence for acute intra-abdominal or pelvic abnormality. 3. Gallstones. Electronically Signed   By: Jasmine Pang M.D.   On: 08/02/2022 23:02   CT ABDOMEN PELVIS W CONTRAST  Result Date: 08/02/2022 CLINICAL DATA:  Right-sided chest pain abdomen pain EXAM: CT ANGIOGRAPHY CHEST CT ABDOMEN AND  PELVIS WITH CONTRAST TECHNIQUE: Multidetector CT imaging of the chest was performed using the standard protocol during bolus administration of intravenous contrast. Multiplanar CT image reconstructions and MIPs were obtained to evaluate the vascular anatomy. Multidetector CT imaging of the abdomen and pelvis was performed using the standard protocol during bolus administration of intravenous contrast. RADIATION DOSE REDUCTION: This exam was performed according to the departmental dose-optimization program which includes automated exposure control, adjustment of the mA and/or kV according to patient size and/or use of iterative reconstruction technique. CONTRAST:  OMNIPAQUE IOHEXOL 350 MG/ML SOLN COMPARISON:  CT 08/26/2021, CT chest  08/01/2021 FINDINGS: CTA CHEST FINDINGS Cardiovascular: Satisfactory opacification of the pulmonary arteries to the segmental level. No evidence of pulmonary embolism. Normal heart size. No pericardial effusion. Nonaneurysmal aorta. No dissection is seen. Mediastinum/Nodes: No enlarged mediastinal, hilar, or axillary lymph nodes. Thyroid gland, trachea, and esophagus demonstrate no significant findings. Lungs/Pleura: Lungs are clear. No pleural effusion or pneumothorax. Musculoskeletal: No chest wall abnormality. No acute or significant osseous findings. Review of the MIP images confirms the above findings. CT ABDOMEN and PELVIS FINDINGS Hepatobiliary: No focal liver abnormality is seen. Gallstones. No biliary dilatation. Pancreas: Unremarkable. No pancreatic ductal dilatation or surrounding inflammatory changes. Spleen: Normal in size without focal abnormality. Adrenals/Urinary Tract: Adrenal glands are unremarkable. Kidneys are normal, without renal calculi, focal lesion, or hydronephrosis. Bladder is unremarkable. Stomach/Bowel: Stomach is within normal limits. Appendix appears normal. No evidence of bowel wall thickening, distention, or inflammatory changes. Vascular/Lymphatic: No significant vascular findings are present. No enlarged abdominal or pelvic lymph nodes. Reproductive: Uterus and bilateral adnexa are unremarkable. Other: No abdominal wall hernia or abnormality. No abdominopelvic ascites. Musculoskeletal: No acute or significant osseous findings. Review of the MIP images confirms the above findings. IMPRESSION: 1. Negative for acute pulmonary embolus or aortic dissection. Clear lung fields. 2. No CT evidence for acute intra-abdominal or pelvic abnormality. 3. Gallstones. Electronically Signed   By: Jasmine Pang M.D.   On: 08/02/2022 23:02    Procedures Procedures    Medications Ordered in ED Medications  sodium chloride 0.9 % bolus 1,000 mL (0 mLs Intravenous Stopped 08/02/22 2316)  morphine  (PF) 4 MG/ML injection 4 mg (4 mg Intravenous Given 08/02/22 2220)  ondansetron (ZOFRAN) injection 4 mg (4 mg Intravenous Given 08/02/22 2220)  famotidine (PEPCID) IVPB 20 mg premix (0 mg Intravenous Stopped 08/02/22 2250)  iohexol (OMNIPAQUE) 350 MG/ML injection 100 mL (100 mLs Intravenous Contrast Given 08/02/22 2241)    ED Course/ Medical Decision Making/ A&P                           Medical Decision Making Amount and/or Complexity of Data Reviewed Labs: ordered. Radiology: ordered.  Risk Prescription drug management.   This patient presents to the ED for concern of chest and abd pain, this involves an extensive number of treatment options, and is a complaint that carries with it a high risk of complications and morbidity.  The differential diagnosis includes PE, cholecystitis, cholelithiasis, pancreatitis, gerd, pregnancy   Co morbidities that complicate the patient evaluation  Hx gallstones   Additional history obtained:  Additional history obtained from epic chart review External records from outside source obtained and reviewed including friends   Lab Tests:  I Ordered, and personally interpreted labs.  The pertinent results include:  cbc nl, cmp nl, lip nl, preg neg, ua nl, covid/flu neg   Imaging Studies ordered:  I ordered imaging studies including  ct chest and ct abd/pelvis  I independently visualized and interpreted imaging which showed  CT chest and abd/pelvis: 1. Negative for acute pulmonary embolus or aortic dissection. Clear  lung fields.  2. No CT evidence for acute intra-abdominal or pelvic abnormality.  3. Gallstones.    I agree with the radiologist interpretation   Cardiac Monitoring:  The patient was maintained on a cardiac monitor.  I personally viewed and interpreted the cardiac monitored which showed an underlying rhythm of: nsr   Medicines ordered and prescription drug management:  I ordered medication including morphine/zofran  for  pain and nausea  Reevaluation of the patient after these medicines showed that the patient improved I have reviewed the patients home medicines and have made adjustments as needed   Test Considered:  ct   Critical Interventions:  Pain control  Problem List / ED Course:  Cholelithiasis:  pt encouraged to avoid fatty and greasy foods.  Pain is under control now.  She is encouraged to f/u with surgery.  Return if worse.    Reevaluation:  After the interventions noted above, I reevaluated the patient and found that they have :improved   Social Determinants of Health:  Lives at home   Dispostion:  After consideration of the diagnostic results and the patients response to treatment, I feel that the patent would benefit from discharge with outpatient f/u.          Final Clinical Impression(s) / ED Diagnoses Final diagnoses:  Calculus of gallbladder without cholecystitis without obstruction    Rx / DC Orders ED Discharge Orders          Ordered    HYDROcodone-acetaminophen (NORCO/VICODIN) 5-325 MG tablet  Every 4 hours PRN        08/02/22 2321    ondansetron (ZOFRAN-ODT) 4 MG disintegrating tablet  Every 8 hours PRN        08/02/22 2321              Jacalyn Lefevre, MD 08/03/22 1552

## 2022-08-05 ENCOUNTER — Encounter (HOSPITAL_COMMUNITY): Payer: Self-pay

## 2022-08-05 ENCOUNTER — Emergency Department (HOSPITAL_COMMUNITY)
Admission: EM | Admit: 2022-08-05 | Discharge: 2022-08-05 | Disposition: A | Discharge status: Home / Self Care | Payer: Medicaid Other | Attending: Emergency Medicine | Admitting: Emergency Medicine

## 2022-08-05 ENCOUNTER — Other Ambulatory Visit: Payer: Self-pay

## 2022-08-05 DIAGNOSIS — Z20822 Contact with and (suspected) exposure to covid-19: Secondary | ICD-10-CM | POA: Diagnosis not present

## 2022-08-05 DIAGNOSIS — R0981 Nasal congestion: Secondary | ICD-10-CM | POA: Diagnosis not present

## 2022-08-05 DIAGNOSIS — R059 Cough, unspecified: Secondary | ICD-10-CM | POA: Insufficient documentation

## 2022-08-05 DIAGNOSIS — R630 Anorexia: Secondary | ICD-10-CM | POA: Insufficient documentation

## 2022-08-05 DIAGNOSIS — R079 Chest pain, unspecified: Secondary | ICD-10-CM | POA: Insufficient documentation

## 2022-08-05 LAB — CBC WITH DIFFERENTIAL/PLATELET
Abs Immature Granulocytes: 0.01 10*3/uL (ref 0.00–0.07)
Basophils Absolute: 0 10*3/uL (ref 0.0–0.1)
Basophils Relative: 0 %
Eosinophils Absolute: 0.2 10*3/uL (ref 0.0–0.5)
Eosinophils Relative: 3 %
HCT: 39.2 % (ref 36.0–46.0)
Hemoglobin: 12.9 g/dL (ref 12.0–15.0)
Immature Granulocytes: 0 %
Lymphocytes Relative: 29 %
Lymphs Abs: 2.2 10*3/uL (ref 0.7–4.0)
MCH: 27.4 pg (ref 26.0–34.0)
MCHC: 32.9 g/dL (ref 30.0–36.0)
MCV: 83.2 fL (ref 80.0–100.0)
Monocytes Absolute: 0.5 10*3/uL (ref 0.1–1.0)
Monocytes Relative: 7 %
Neutro Abs: 4.7 10*3/uL (ref 1.7–7.7)
Neutrophils Relative %: 61 %
Platelets: 237 10*3/uL (ref 150–400)
RBC: 4.71 MIL/uL (ref 3.87–5.11)
RDW: 12.5 % (ref 11.5–15.5)
WBC: 7.6 10*3/uL (ref 4.0–10.5)
nRBC: 0 % (ref 0.0–0.2)

## 2022-08-05 LAB — BASIC METABOLIC PANEL
Anion gap: 9 (ref 5–15)
BUN: 14 mg/dL (ref 6–20)
CO2: 24 mmol/L (ref 22–32)
Calcium: 8.9 mg/dL (ref 8.9–10.3)
Chloride: 104 mmol/L (ref 98–111)
Creatinine, Ser: 0.67 mg/dL (ref 0.44–1.00)
GFR, Estimated: >60 mL/min (ref >60)
Glucose, Bld: 82 mg/dL (ref 70–99)
Potassium: 3.7 mmol/L (ref 3.5–5.1)
Sodium: 137 mmol/L (ref 135–145)

## 2022-08-05 LAB — GROUP A STREP BY PCR: Group A Strep by PCR: NOT DETECTED

## 2022-08-05 LAB — RESP PANEL BY RT-PCR (RSV, FLU A&B, COVID)  RVPGX2
Influenza A by PCR: NEGATIVE
Influenza B by PCR: NEGATIVE
Resp Syncytial Virus by PCR: NEGATIVE
SARS Coronavirus 2 by RT PCR: NEGATIVE

## 2022-08-05 LAB — D-DIMER, QUANTITATIVE: D-Dimer, Quant: 0.36 ug/mL-FEU (ref 0.00–0.50)

## 2022-08-05 LAB — TROPONIN I (HIGH SENSITIVITY): Troponin I (High Sensitivity): <2 ng/L (ref <18)

## 2022-08-05 NOTE — Discharge Instructions (Signed)
Your workup this evening was reassuring.  Please keep your upcoming appointment with a cardiologist.  Return to the ER for any new or worsening symptoms.

## 2022-08-05 NOTE — ED Triage Notes (Signed)
Patient states dizziness, panic attack, cough, congestion and lack of appetite.

## 2022-08-06 NOTE — ED Provider Notes (Signed)
[facility] EMERGENCY DEPARTMENT Provider Note   CSN: [id number] Arrival date & time: 08/05/22  1611     History  Chief Complaint  Patient presents with   URI    Ana Gordon is a 21 y.o. female.   URI Presenting symptoms: congestion and cough   Presenting symptoms: no fever   Associated symptoms: no arthralgias, no myalgias and no neck pain        Ana Gordon is a 21 y.o. female who presents to the Emergency Department with multiple complaints.  She describes having cough, congestion, decreased appetite, intermittent episodes of chest pain and dizziness.  She is concerned that she may be having anxiety attacks.  She states she was recently evaluated at another emergency department for similar symptoms.  She states the cough and congestion are new.  She denies any shortness of breath, fever or chills.    Home Medications Prior to Admission medications   Medication Sig Start Date End Date Taking? Authorizing Provider  benzonatate (TESSALON) 100 MG capsule Take 1 capsule (100 mg total) by mouth 3 (three) times daily as needed for cough. Do not take with alcohol or while driving or operating heavy machinery.  May cause drowsiness. 05/29/22   Valentino Nose, NP  fexofenadine-pseudoephedrine (ALLEGRA-D) 60-120 MG 12 hr tablet Take 1 tablet by mouth every 12 (twelve) hours. 05/21/22   Gilda Crease, MD  fluticasone (FLONASE) 50 MCG/ACT nasal spray Place 1 spray into both nostrils daily. 05/21/22   Gilda Crease, MD  HYDROcodone-acetaminophen (NORCO/VICODIN) 5-325 MG tablet Take 1 tablet by mouth every 4 (four) hours as needed. 08/02/22   Jacalyn Lefevre, MD  ondansetron (ZOFRAN-ODT) 4 MG disintegrating tablet Take 1 tablet (4 mg total) by mouth every 8 (eight) hours as needed. 08/02/22   Jacalyn Lefevre, MD  LO LOESTRIN FE 1 MG-10 MCG / 10 MCG tablet Take 1 tablet by mouth daily. Patient not taking: Reported on 12/06/2019 03/01/19 01/12/21  Cheral Marker, CNM      Allergies    Bactrim [sulfamethoxazole-trimethoprim]    Review of Systems   Review of Systems  Constitutional:  Positive for appetite change. Negative for chills and fever.  HENT:  Positive for congestion.   Respiratory:  Positive for cough. Negative for shortness of breath.   Cardiovascular:  Positive for chest pain.  Gastrointestinal:  Negative for abdominal pain, diarrhea, nausea and vomiting.  Genitourinary:  Negative for dysuria and urgency.  Musculoskeletal:  Negative for arthralgias, myalgias, neck pain and neck stiffness.  Skin:  Negative for rash.  Psychiatric/Behavioral:  The patient is nervous/anxious.     Physical Exam Updated Vital Signs BP 115/69   Pulse 68   Temp 98 F (36.7 C)   Resp 16   Ht 5\' 3"  (1.6 m)   Wt 83.9 kg   LMP 07/18/2022   SpO2 100%   BMI 32.77 kg/m  Physical Exam Vitals and nursing note reviewed.  Constitutional:      General: She is not in acute distress.    Appearance: Normal appearance. She is not ill-appearing or toxic-appearing.     Comments: Patient is anxious appearing, slightly tearful  HENT:     Right Ear: Tympanic membrane and ear canal normal.     Left Ear: Tympanic membrane and ear canal normal.     Mouth/Throat:     Mouth: Mucous membranes are moist.     Pharynx: Oropharynx is clear.  Cardiovascular:     Rate and Rhythm: Normal rate  and regular rhythm.     Pulses: Normal pulses.  Pulmonary:     Effort: Pulmonary effort is normal.     Breath sounds: Normal breath sounds.  Musculoskeletal:        General: No swelling. Normal range of motion.     Cervical back: Normal range of motion. No rigidity.  Skin:    General: Skin is warm.     Capillary Refill: Capillary refill takes less than 2 seconds.     Findings: No rash.  Neurological:     General: No focal deficit present.     Mental Status: She is alert.     Sensory: No sensory deficit.     Motor: No weakness.     ED Results / Procedures /  Treatments   Labs (all labs ordered are listed, but only abnormal results are displayed) Labs Reviewed  RESP PANEL BY RT-PCR (RSV, FLU A&B, COVID)  [id number]  GROUP A STREP BY PCR  CBC WITH DIFFERENTIAL/PLATELET  BASIC METABOLIC PANEL  D-DIMER, QUANTITATIVE  TROPONIN I (HIGH SENSITIVITY)    EKG EKG Interpretation  Date/Time:  Tuesday August 05 2022 20:29:06 EST Ventricular Rate:  63 PR Interval:  162 QRS Duration: 105 QT Interval:  413 QTC Calculation: 423 R Axis:   63 Text Interpretation: Sinus arrhythmia No significant change since last tracing Confirmed by Susy Frizzle [phone]) on 08/06/2022 9:38:25 AM  Radiology No results found.  Procedures Procedures    Medications Ordered in ED Medications - No data to display  ED Course/ Medical Decision Making/ A&P                           Medical Decision Making Patient here with multiple complaints, has complaints of chest pain, decreased appetite, feeling anxious cough and congestion.  Recently seen at another facility with similar complaints.  States she was referred to a cardiologist.  She is awaiting an appointment in a couple of weeks.  Here because she has developed cough and congestion which she states are new.  On exam, patient well-appearing nontoxic.  Slightly tearful and anxious appearing.  Vital signs are reassuring.  PERC negative.  Clinically I suspect patient symptoms are related to anxiety.  Since her cough and congestion are new she may also have a viral process.  ACS, arrhythmia, and PE also considered but felt less likely.  Amount and/or Complexity of Data Reviewed Labs: ordered.    Details: Labs interpreted by me, no evidence of leukocytosis, D-dimer reassuring.  Chemistries without derangement.  Troponin less than 2.  Respiratory panel and strep PCR all negative. Radiology: ordered. ECG/medicine tests: ordered.    Details: EKG reviewed, showing sinus arrhythmia, no significant change since last  tracing Discussion of management or test interpretation with external provider(s): Discussed findings with patient, reassured.  Symptoms felt to be related to anxiety but she may also be having palpitations.  On review of medical records, patient has pending appointment with cardiology.  Doubt emergent process.  Patient appropriate for discharge home, will keep her upcoming cardiology appointment.           Final Clinical Impression(s) / ED Diagnoses Final diagnoses:  Nonspecific chest pain    Rx / DC Orders ED Discharge Orders     None         Rosey Bath 08/06/22 1602    Gloris Manchester, MD 08/06/22 [phone]

## 2022-08-13 ENCOUNTER — Inpatient Hospital Stay (HOSPITAL_COMMUNITY): Admission: RE | Admit: 2022-08-13 | Payer: Medicaid Other | Source: Ambulatory Visit

## 2022-08-14 ENCOUNTER — Ambulatory Visit (HOSPITAL_COMMUNITY): Payer: Medicaid Other

## 2022-10-16 ENCOUNTER — Encounter: Payer: Self-pay | Admitting: General Practice

## 2022-10-16 ENCOUNTER — Ambulatory Visit: Payer: Medicaid Other | Admitting: Family Medicine

## 2022-10-16 ENCOUNTER — Encounter: Payer: Self-pay | Admitting: Family Medicine

## 2022-10-27 IMAGING — CT CT HEAD W/O CM
3 series · 16 of 47 positions shown, 19 images · non-contrast
Comparison: None.

CLINICAL DATA: Headache 3 weeks

EXAM:
CT HEAD WITHOUT CONTRAST
TECHNIQUE: Contiguous axial images were obtained from the base of the skull
through the vertex without intravenous contrast.

[Series 2: head w o · axial · 0.41mm/px · z∈[+75,+205]mm · 10 of 32 slices shown, 13 images]
[im 3/32  brain]
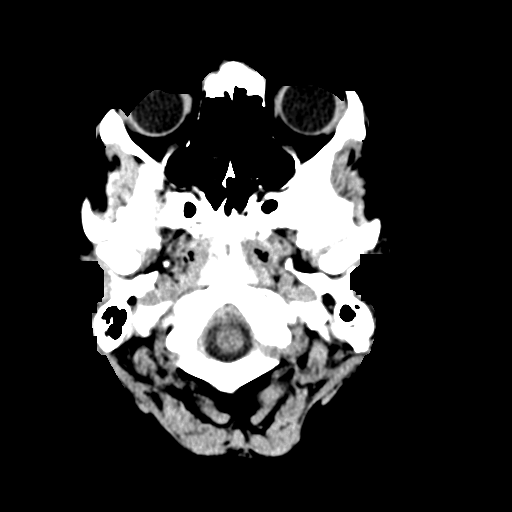
[im 3/32  bone]
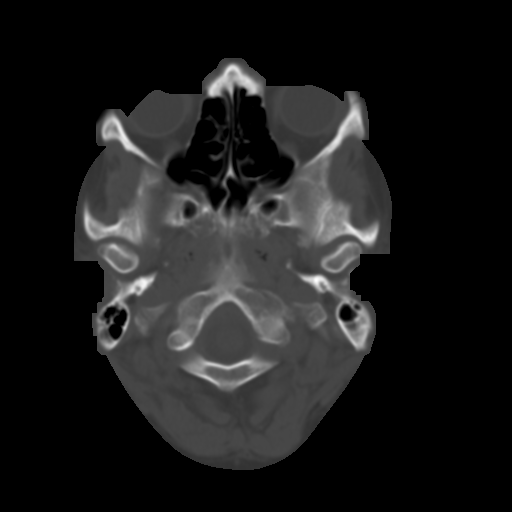
[im 6/32  brain]
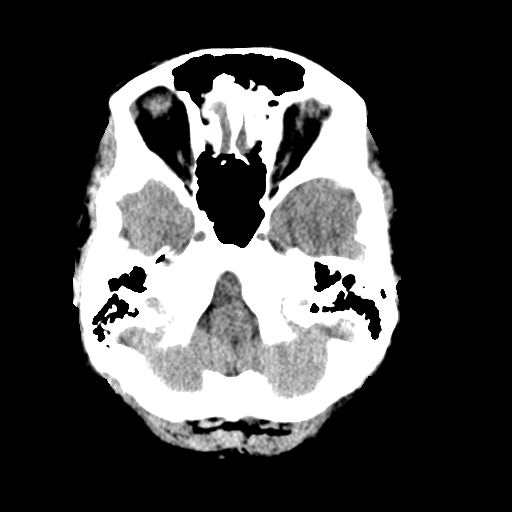
[im 9/32  brain]
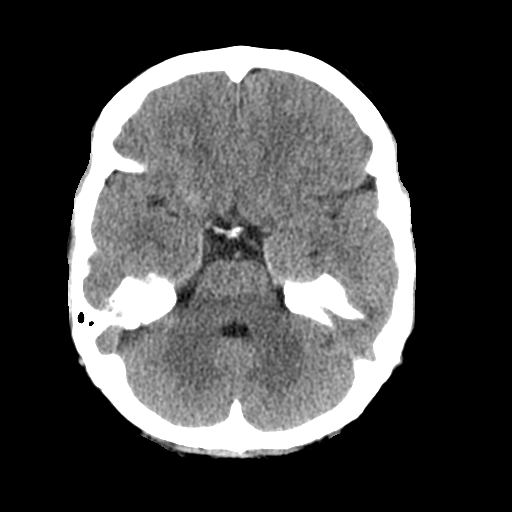
[im 11/32  brain]
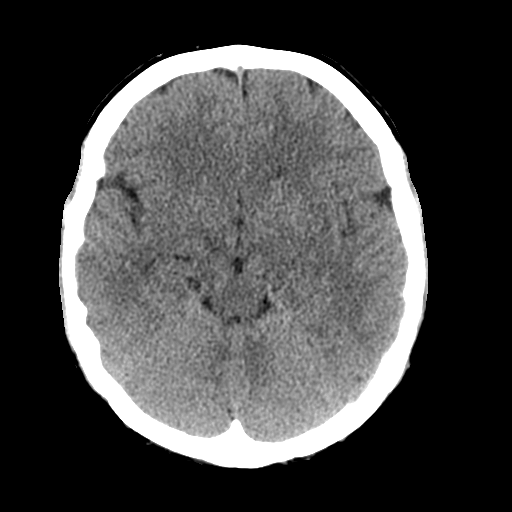
[im 14/32  brain]
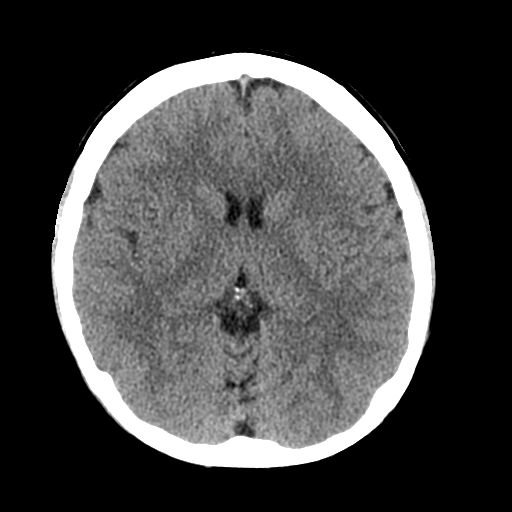
[im 14/32  bone]
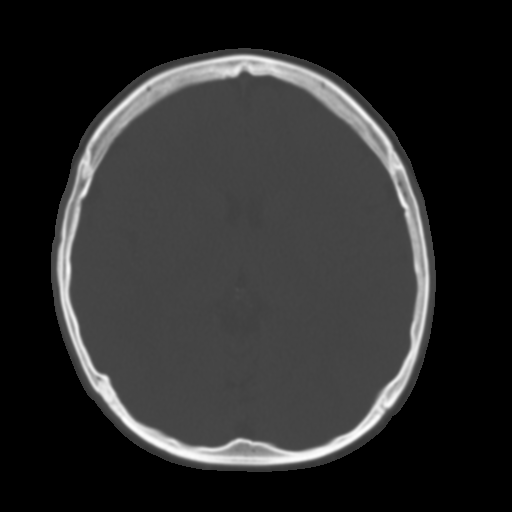
[im 18/32  brain]
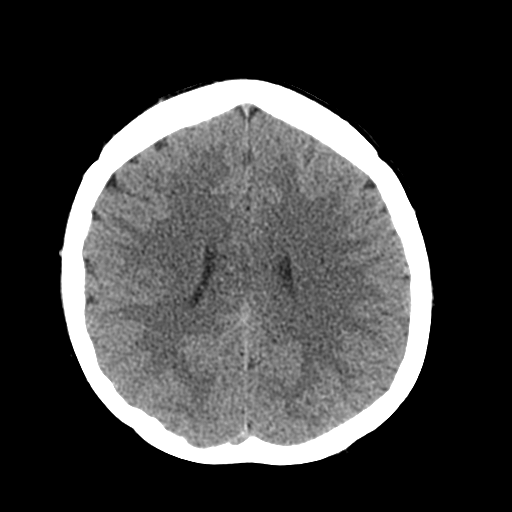
[im 21/32  brain]
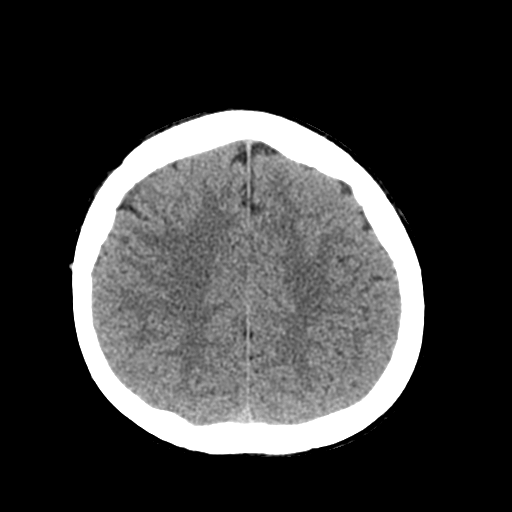
[im 24/32  brain]
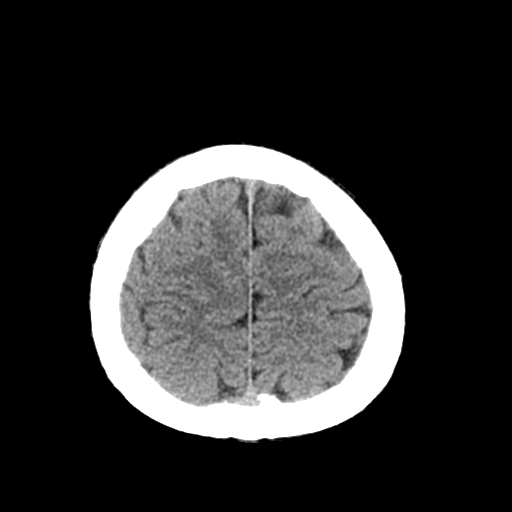
[im 26/32  brain]
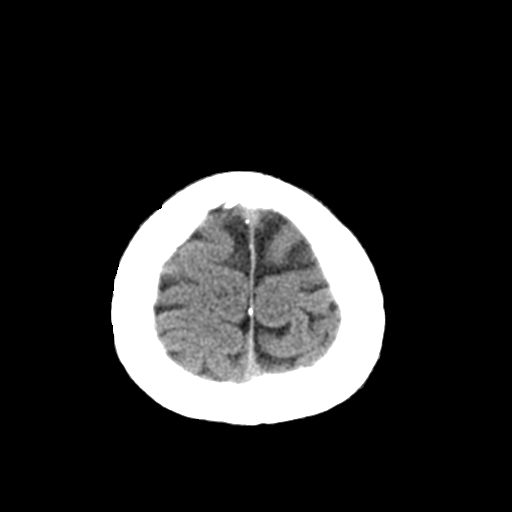
[im 26/32  bone]
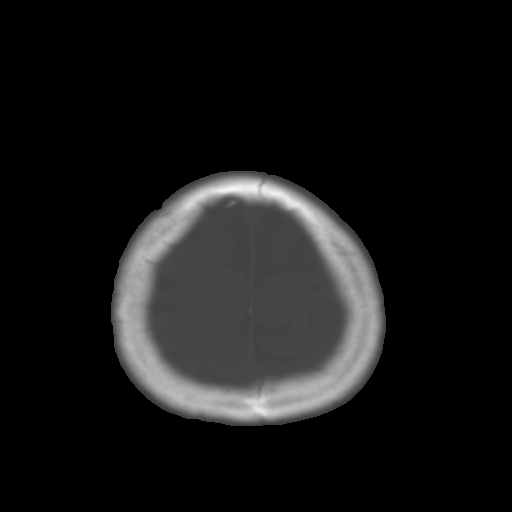
[im 29/32  brain]
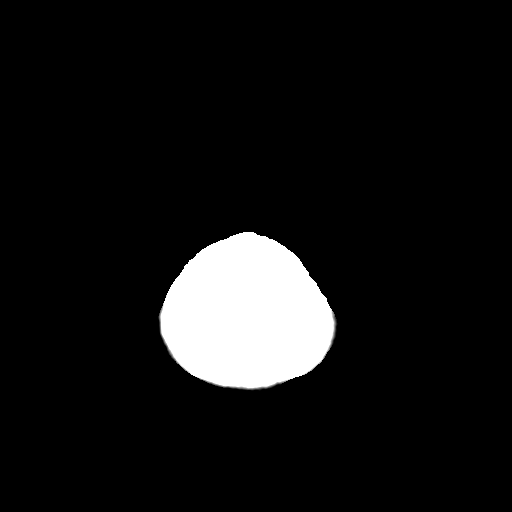

[Series 4: coronal soft · coronal · 0.32mm/px · 3 of 65 slices shown]
[im 22/65  brain]
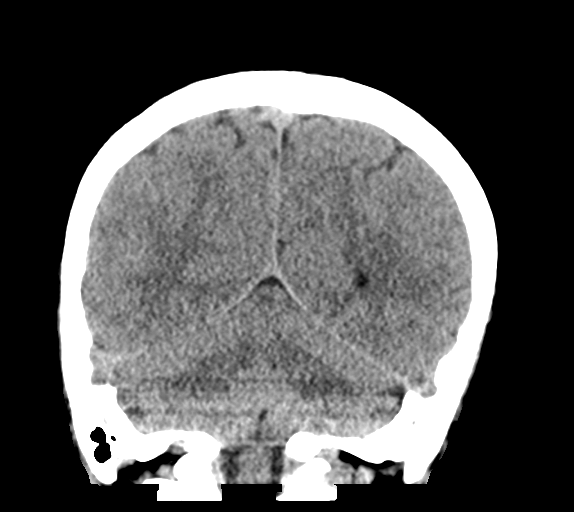
[im 29/65  brain]
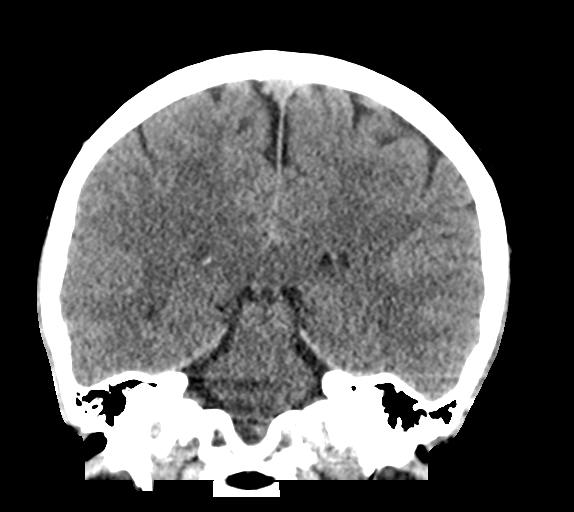
[im 36/65  brain]
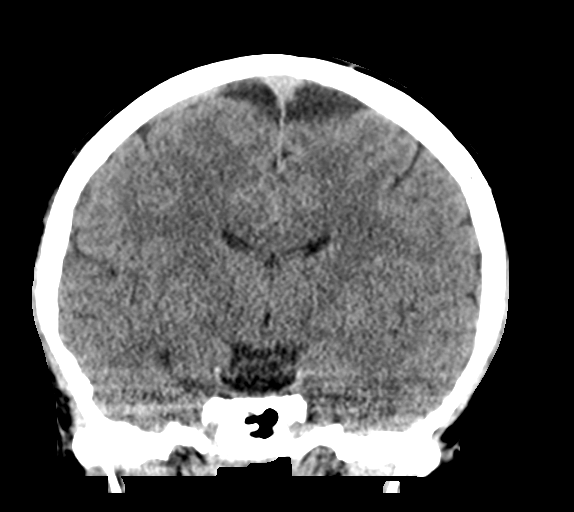

[Series 5: sagittal soft · sagittal · 0.32mm/px · 3 of 59 slices shown]
[im 20/59  brain]
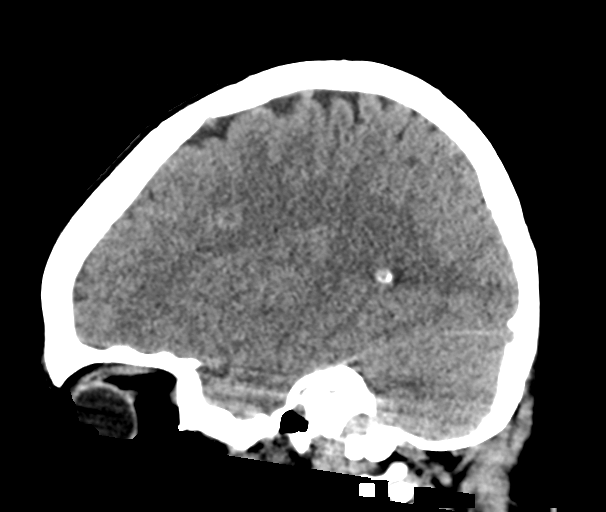
[im 30/59  brain]
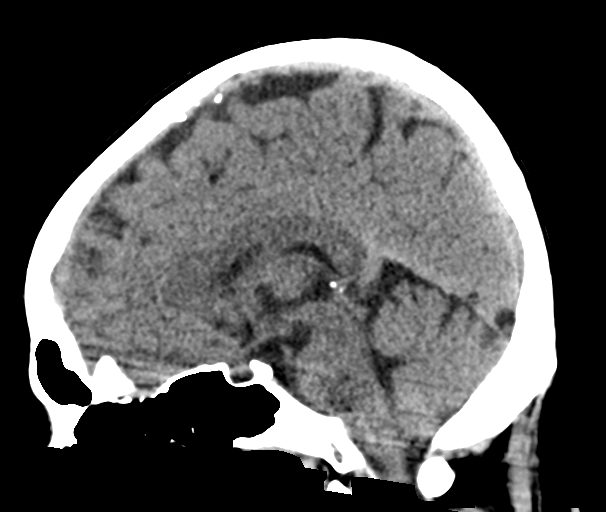
[im 39/59  brain]
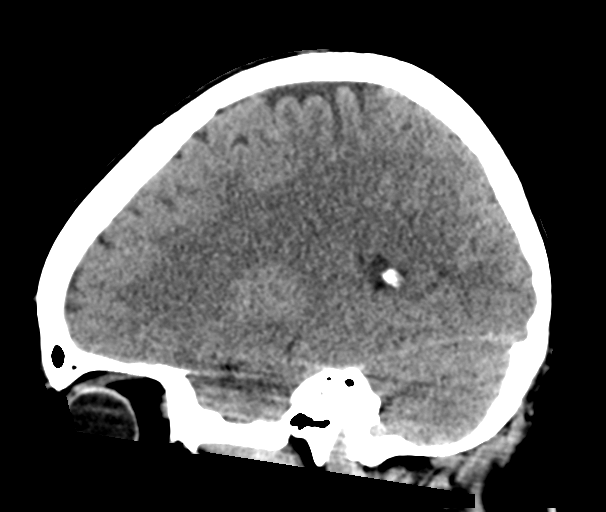

[16 of 47 positions shown; findings below may reference images not displayed]

FINDINGS: Brain: No evidence of acute infarction, hemorrhage, hydrocephalus,
extra-axial collection or mass lesion/mass effect.

Vascular: No hyperdense vessel or unexpected calcification.

Skull: Normal. Negative for fracture or focal lesion.

Sinuses/Orbits: No acute finding.

Other: None.
IMPRESSION: No acute intracranial findings.

## 2022-11-29 ENCOUNTER — Encounter (HOSPITAL_COMMUNITY): Payer: Self-pay | Admitting: Emergency Medicine

## 2022-11-29 ENCOUNTER — Emergency Department (HOSPITAL_COMMUNITY): Payer: Medicaid Other

## 2022-11-29 ENCOUNTER — Emergency Department (HOSPITAL_COMMUNITY)
Admission: EM | Admit: 2022-11-29 | Discharge: 2022-11-30 | Disposition: A | Discharge status: Home / Self Care | Payer: Medicaid Other | Attending: Emergency Medicine | Admitting: Emergency Medicine

## 2022-11-29 DIAGNOSIS — H538 Other visual disturbances: Secondary | ICD-10-CM | POA: Insufficient documentation

## 2022-11-29 DIAGNOSIS — R519 Headache, unspecified: Secondary | ICD-10-CM | POA: Diagnosis present

## 2022-11-29 DIAGNOSIS — G932 Benign intracranial hypertension: Secondary | ICD-10-CM

## 2022-11-29 LAB — BASIC METABOLIC PANEL
Anion gap: 10 (ref 5–15)
BUN: 10 mg/dL (ref 6–20)
CO2: 23 mmol/L (ref 22–32)
Calcium: 8.9 mg/dL (ref 8.9–10.3)
Chloride: 105 mmol/L (ref 98–111)
Creatinine, Ser: 0.63 mg/dL (ref 0.44–1.00)
GFR, Estimated: >60 mL/min (ref >60)
Glucose, Bld: 95 mg/dL (ref 70–99)
Potassium: 4.1 mmol/L (ref 3.5–5.1)
Sodium: 138 mmol/L (ref 135–145)

## 2022-11-29 LAB — CBC WITH DIFFERENTIAL/PLATELET
Abs Immature Granulocytes: 0.04 10*3/uL (ref 0.00–0.07)
Basophils Absolute: 0 10*3/uL (ref 0.0–0.1)
Basophils Relative: 0 %
Eosinophils Absolute: 0.2 10*3/uL (ref 0.0–0.5)
Eosinophils Relative: 2 %
HCT: 38.4 % (ref 36.0–46.0)
Hemoglobin: 12.1 g/dL (ref 12.0–15.0)
Immature Granulocytes: 0 %
Lymphocytes Relative: 31 %
Lymphs Abs: 2.9 10*3/uL (ref 0.7–4.0)
MCH: 26.7 pg (ref 26.0–34.0)
MCHC: 31.5 g/dL (ref 30.0–36.0)
MCV: 84.8 fL (ref 80.0–100.0)
Monocytes Absolute: 0.7 10*3/uL (ref 0.1–1.0)
Monocytes Relative: 8 %
Neutro Abs: 5.4 10*3/uL (ref 1.7–7.7)
Neutrophils Relative %: 59 %
Platelets: 216 10*3/uL (ref 150–400)
RBC: 4.53 MIL/uL (ref 3.87–5.11)
RDW: 12.7 % (ref 11.5–15.5)
WBC: 9.1 10*3/uL (ref 4.0–10.5)
nRBC: 0 % (ref 0.0–0.2)

## 2022-11-29 LAB — I-STAT BETA HCG BLOOD, ED (MC, WL, AP ONLY): I-stat hCG, quantitative: <5 m[IU]/mL (ref <5)

## 2022-11-29 MED ORDER — METOCLOPRAMIDE HCL 5 MG/ML IJ SOLN
10.0000 mg | Freq: Once | INTRAMUSCULAR | Status: AC
  Administered 2022-11-29: 10 mg via INTRAVENOUS
  Filled 2022-11-29: qty 2

## 2022-11-29 MED ORDER — DIPHENHYDRAMINE HCL 50 MG/ML IJ SOLN
12.5000 mg | Freq: Once | INTRAMUSCULAR | Status: AC
  Administered 2022-11-29: 12.5 mg via INTRAVENOUS
  Filled 2022-11-29: qty 1

## 2022-11-29 MED ORDER — LACTATED RINGERS IV BOLUS
1000.0000 mL | Freq: Once | INTRAVENOUS | Status: AC
  Administered 2022-11-29: 1000 mL via INTRAVENOUS

## 2022-11-29 NOTE — ED Provider Notes (Signed)
[place] EMERGENCY DEPARTMENT AT [facility] Provider Note   CSN: [id number] Arrival date & time: 11/29/22  2010    History  Chief Complaint  Patient presents with   Eye Problem    Rokhaya Dils is a 22 y.o. female has a past medical history here for evaluation of headache and blurred vision.  Headache has been going on for quite some time, over the last few months.  Intermittent in nature.  Has noted over the last 2 weeks she has had bilateral blurred vision.  Described as "haziness."  Denies any visual field cuts.  No numbness or weakness.  No sudden onset thunderclap headache.  No neck pain or neck stiffness.  No nausea or vomiting.  Does feel lightheaded when she has position changes.  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  She was seen at an outside emergency department 2 days ago which they told her she possibly had a sinusitis and that she need to follow-up outpatient.  She returns today as her headache has not improved over the last 4 days.  No prior history of ICH, trauma, MS.  HPI     Home Medications Prior to Admission medications   Medication Sig Start Date End Date Taking? Authorizing Provider  amoxicillin (AMOXIL) 875 MG tablet Take 875 mg by mouth 2 (two) times daily.   Yes [provider]  benzonatate (TESSALON) 100 MG capsule Take 1 capsule (100 mg total) by mouth 3 (three) times daily as needed for cough. Do not take with alcohol or while driving or operating heavy machinery.  May cause drowsiness. Patient not taking: Reported on 11/29/2022 05/29/22   Valentino Nose, NP  cloNIDine (CATAPRES) 0.1 MG tablet Take by mouth. Patient not taking: Reported on 11/29/2022 12/23/11   [provider]  fexofenadine-pseudoephedrine (ALLEGRA-D) 60-120 MG 12 hr tablet Take 1 tablet by mouth every 12  (twelve) hours. Patient not taking: Reported on 11/29/2022 05/21/22   Gilda Crease, MD  fluticasone [facility]) 50 MCG/ACT nasal spray Place 1 spray into both nostrils daily. Patient not taking: Reported on 11/29/2022 05/21/22   Gilda Crease, MD  HYDROcodone-acetaminophen (NORCO/VICODIN) 5-325 MG tablet Take 1 tablet by mouth every 4 (four) hours as needed. Patient not taking: Reported on 11/29/2022 08/02/22   Jacalyn Lefevre, MD  ondansetron (ZOFRAN-ODT) 4 MG disintegrating tablet Take 1 tablet (4 mg total) by mouth every 8 (eight) hours as needed. Patient not taking: Reported on 11/29/2022 08/02/22   Jacalyn Lefevre, MD  LO LOESTRIN FE 1 MG-10 MCG / 10 MCG tablet Take 1 tablet by mouth daily. Patient not taking: Reported on 12/06/2019 03/01/19 01/12/21  Cheral Marker, CNM      Allergies    Bactrim [sulfamethoxazole-trimethoprim]    Review of Systems   Review of Systems  Constitutional: Negative.   HENT: Negative.    Eyes:  Positive for visual disturbance.  Respiratory: Negative.    Cardiovascular: Negative.   Gastrointestinal: Negative.   Genitourinary: Negative.   Musculoskeletal: Negative.   Skin: Negative.   Neurological:  Positive for dizziness (with position changes), light-headedness and headaches. Negative for tremors, seizures, syncope, facial asymmetry, speech difficulty, weakness and numbness.  All other systems reviewed and are negative.   Physical Exam Updated Vital Signs BP 114/82   Pulse 75   Temp 98.6  F (37 C) (Oral)   Resp 12   LMP 11/10/2022   SpO2 100%  Physical Exam Physical Exam  Constitutional: Pt is oriented to person, place, and time. Pt appears well-developed and well-nourished. No distress.  HENT:  Head: Normocephalic and atraumatic.  Mouth/Throat: Oropharynx is clear and moist.  Eyes: Conjunctivae and EOM are normal. Pupils are equal, round, and reactive to light. No scleral icterus.  No horizontal, vertical or rotational  nystagmus  Neck: Normal range of motion. Neck supple.  Full active and passive ROM without pain No midline or paraspinal tenderness No nuchal rigidity or meningeal signs  Cardiovascular: Normal rate, regular rhythm and intact distal pulses.   Pulmonary/Chest: Effort normal and breath sounds normal. No respiratory distress. Pt has no wheezes. No rales.  Abdominal: Soft. Bowel sounds are normal. There is no tenderness. There is no rebound and no guarding.  Musculoskeletal: Normal range of motion.  Lymphadenopathy:    No cervical adenopathy.  Neurological: Pt. is alert and oriented to person, place, and time. He has normal reflexes. No cranial nerve deficit.  Exhibits normal muscle tone. Coordination normal.  Mental Status:  Alert, oriented, thought content appropriate. Speech fluent without evidence of aphasia. Able to follow 2 step commands without difficulty.  Cranial Nerves 2- 12 intact Motor:  5/5 equal BIL Sensory: Pinprick and light touch normal in all extremities.  Deep Tendon Reflexes: symmetric  Cerebellar: normal finger-to-nose with bilateral upper extremities Gait: normal gait and balance CV: distal pulses palpable throughout   Skin: Skin is warm and dry. No rash noted. Pt is not diaphoretic.  Psychiatric: Pt has a normal mood and affect. Behavior is normal. Judgment and thought content normal.  Nursing note and vitals reviewed.  ED Results / Procedures / Treatments   Labs (all labs ordered are listed, but only abnormal results are displayed) Labs Reviewed  CBC WITH DIFFERENTIAL/PLATELET  BASIC METABOLIC PANEL  I-STAT BETA HCG BLOOD, ED ([facility], [facility], [facility] ONLY)    EKG None  Radiology No results found.  Procedures Procedures    Medications Ordered in ED Medications  metoCLOPramide (REGLAN) injection 10 mg (10 mg Intravenous Given 11/29/22 2219)  diphenhydrAMINE (BENADRYL) injection 12.5 mg (12.5 mg Intravenous Given 11/29/22 2219)  lactated ringers bolus 1,000 mL (0 mLs  Intravenous Stopped 11/29/22 2326)    ED Course/ Medical Decision Making/ A&P    22 year old here for evaluation of headache and blurred vision that is been ongoing over the last few months.  Apparently was seen by ophthalmology over the last week and told she had papilledema and optic nerve swelling and she needed MRI and LP.  Seen by outside emergency department 2 days ago which she was told she had sinusitis and need to follow-up outpatient.  She returns today due to continued symptoms.  She has a nonfocal neuroexam without deficits.  She denies any eye pain.  No midline neck pain, neck stiffness or rigidity.  Low suspicion for meningitis.  Does have some lightheadedness and worsening headache with position changes. Will plan on labs, imaging and reassess. Plan on treatment HA in meantime.  Labs and imaging personally viewed and interpreted:  CBC without leukocytosis BMP without significant abnormality Preg neg MR brain pending MR orbits pending  Care transferred to oncoming provider, [place] follow-up on MRI and LP, dispo pending at change of shift  Medical Decision Making Amount and/or Complexity of Data Reviewed Independent Historian: friend External Data Reviewed: labs, radiology and notes. Labs: ordered. Decision-making details documented in ED Course. Radiology: ordered and independent interpretation performed. Decision-making details documented in ED Course.  Risk OTC drugs. Prescription drug management. Decision regarding hospitalization. Diagnosis or treatment significantly limited by social determinants of health.          Final Clinical Impression(s) / ED Diagnoses Final diagnoses:  Acute nonintractable headache, unspecified headache type    Rx / DC Orders ED Discharge Orders     None         Marykatherine Sherwood A, PA-C 11/29/22 2358    Glyn Ade, MD 11/30/22 1448

## 2022-11-29 NOTE — ED Triage Notes (Signed)
Pt reports saw eye doctor for vision changes that have been going on for months. Doc told her optic nerve was swollen and she needed MRI. This was 4 days ago- pt states "she was going to wait it out"

## 2022-11-29 NOTE — ED Provider Notes (Signed)
Accepted handoff at shift change from [facility]. Please see prior provider note for more detail.   Briefly: Patient is 22 y.o.   "Ana Gordon is a 22 y.o. female has a past medical history here for evaluation of headache and blurred vision.  Headache has been going on for quite some time, over the last few months.  Intermittent in nature.  Has noted over the last 2 weeks she has had bilateral blurred vision.  Described as "haziness."  Denies any visual field cuts.  No numbness or weakness.  No sudden onset thunderclap headache.  No neck pain or neck stiffness.  No nausea or vomiting.  Does feel lightheaded when she has position changes.  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  She was seen at an outside emergency department 2 days ago which they told her she possibly had a sinusitis and that she need to follow-up outpatient.  She returns today as her headache has not improved over the last 4 days.  No prior history of ICH, trauma, MS."   Physical Exam  BP 123/70   Pulse 82   Temp 98.6 F (37 C) (Oral)   Resp 16   LMP 11/10/2022   SpO2 99%   Physical Exam  Procedures  Procedures  ED Course / MDM   Clinical Course as of 11/30/22 0751  Sun Nov 30, 2022  0437 Diamox start 500 BID every week or two titrate. BMP in one week.  FU outpatient and ophtho  [facility] could be considered down the road.  [WF]    Clinical Course User Index [WF] Gailen Shelter, [place]   Medical Decision Making Amount and/or Complexity of Data Reviewed Labs: ordered. Radiology: ordered.  Risk Prescription drug management.   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 Eudelia Bunch, was able to obtain LP opening pressure  was 41.5 mm Started on Diamox twice daily 500 mg after discussion with [facility] neurology.  Patient will follow-up with ophthalmology and neurology.  Outpatient ambulatory referral order placed.  She has no symptoms currently no headache although she does have some continued blurry vision but states it is no worse.  Will need BMP in 1 week she is understanding of this      Gailen Shelter, [place] 11/30/22 0753    Nira Conn, MD 12/01/22 Ernestina Columbia

## 2022-11-30 MED ORDER — LIDOCAINE-EPINEPHRINE (PF) 2 %-1:200000 IJ SOLN
10.0000 mL | Freq: Once | INTRAMUSCULAR | Status: AC
  Administered 2022-11-30: 10 mL
  Filled 2022-11-30: qty 20

## 2022-11-30 MED ORDER — ACETAZOLAMIDE 250 MG PO TABS
500.0000 mg | ORAL_TABLET | Freq: Once | ORAL | Status: AC
  Administered 2022-11-30: 500 mg via ORAL
  Filled 2022-11-30: qty 2

## 2022-11-30 MED ORDER — ACETAZOLAMIDE 250 MG PO TABS: 500.0000 mg | ORAL_TABLET | Freq: Two times a day (BID) | ORAL | 1 refills | Status: AC

## 2022-11-30 MED ORDER — LIDOCAINE HCL (PF) 1 % IJ SOLN
10.0000 mL | Freq: Once | INTRAMUSCULAR | Status: DC
  Filled 2022-11-30: qty 10

## 2022-11-30 MED ORDER — GADOBUTROL 1 MMOL/ML IV SOLN
8.5000 mL | Freq: Once | INTRAVENOUS | Status: AC | PRN
  Administered 2022-11-30: 8.5 mL via INTRAVENOUS

## 2022-11-30 NOTE — Discharge Instructions (Addendum)
You were diagnosed today with increased intracranial pressure.  You will need to take the medication Diamox 500 mg twice daily.  You will need to get lab work done in 1 week to check your electrolytes.  You will need to follow-up with outside the hospital of ophthalmology and neurology.  I have given you the information for both of these offices and placed a referral for the neurology office.

## 2022-12-01 NOTE — ED Provider Notes (Signed)
Attestation:   I provided a substantive portion of the care of this patient. I personally provided more than half of the total time dedicated to treatment of this patient. I personally made/approved the management plan for this patient and take responsibility for the patient management.    Briefly, the patient is a 22 y.o. female here for papilledema ruling out mass or IIH  Vitals:   11/30/22 0542 11/30/22 0720  BP: (!) 105/55 (!) 112/52  Pulse: 88 82  Resp: 16 18  Temp: 98.3 F (36.8 C) 97.9 F (36.6 C)  SpO2: 100% 100%    CONSTITUTIONAL:  well-appearing, NAD NEURO:  Alert and oriented x 3, no focal deficits EYES:  pupils equal and reactive ENT/NECK:  trachea midline, no JVD CARDIO:  reg rate, reg rhythm, well-perfused PULM:  none labored breathing GI/GU:  Abdomen non-distended MSK/SPINE:  No gross deformities, no edema SKIN:  no rash, atraumatic PSYCH:  Appropriate speech and behavior   EKG Interpretation  Date/Time:  Saturday November 29 2022 20:19:17 EDT Ventricular Rate:  82 PR Interval:  134 QRS Duration: 94 QT Interval:  370 QTC Calculation: 432 R Axis:   50 Text Interpretation: Normal sinus rhythm Normal ECG When compared with ECG of 05-Aug-2022 20:29, PREVIOUS ECG IS PRESENT Confirmed by Gilda Crease [phone]) on 11/30/2022 12:18:52 PM         Imaging negative for mass/lesion. LP consistent with IIH. Patient started on diamox per neuro. Neuro/ophtho f/u recommended.  .Lumbar Puncture  Date/Time: 12/01/2022 7:15 PM  Performed by: Nira Conn, MD Authorized by: Nira Conn, MD   Consent:    Consent obtained:  Written and verbal   Consent given by:  Patient   Risks discussed:  Bleeding, infection, repeat procedure and nerve damage Universal protocol:    Procedure explained and questions answered to patient or proxy's satisfaction: yes     Imaging studies available: yes     Patient identity confirmed:  Verbally with patient and  arm band Pre-procedure details:    Procedure purpose:  Diagnostic   Preparation: Patient was prepped and draped in usual sterile fashion   Anesthesia:    Anesthesia method:  Local infiltration   Local anesthetic:  Lidocaine 2% WITH epi Procedure details:    Lumbar space:  L3-L4 interspace   Patient position:  L lateral decubitus   Needle gauge:  18   Needle type:  Spinal needle - Quincke tip   Needle length (in):  5.0   Ultrasound guidance: no     Number of attempts:  2   Opening pressure (cm H2O):  41.5   Closing pressure (cm H2O):  30   Fluid appearance:  Clear Post-procedure details:    Puncture site:  Adhesive bandage applied and direct pressure applied   Procedure completion:  Tolerated well, no immediate complications         Sophie Quiles, Amadeo Garnet, MD 12/01/22 Ernestina Columbia

## 2022-12-01 NOTE — Progress Notes (Shared)
[facility] - Clinic Note  12/04/2022   CHIEF COMPLAINT Patient presents for Retina Evaluation  HISTORY OF PRESENT ILLNESS: Ana Gordon is a 22 y.o. female who presents to the clinic today for:  HPI     Retina Evaluation   In both eyes.  This started 2 weeks ago.  Duration of 2 weeks.  Associated Symptoms Floaters.  Negative for Flashes, Distortion, Blind Spot, Pain, Redness, Photophobia, Glare, Trauma, Scalp Tenderness, Jaw Claudication, Shoulder/Hip pain, Fever, Weight Loss and Fatigue.  Context:  distance vision, mid-range vision and near vision.  Treatments tried: diamox.  I, the attending physician,  performed the HPI with the patient and updated documentation appropriately.        Comments   Patient was seen in ED about a week ago. Diagnosed with intracranial hypertension. Had MRI at hospital and lumbal puncture. Patient has been taking diamox  bid po. Started with headaches and spots in vision about 2 weeks ago. Continues to have headaches and spots in vision, especially in OS. Medication has given some relief of headaches and blurred vision. Since starting diamox, patient is having shortness of breath, altered taste. Also hot flashes and tingling in extremities.       Last edited by Rennis Chris, MD on 12/04/2022 12:27 PM.    Pt is here on the referral of [place] for optic disc swelling, pt has had several ED visits over the past several weeks, pt does not take any daily medications except for diamox that she was given after her diagnoses of IIH, pt has no hx of trauma, no hx of any eye problems, she states she has always needed glasses, but her vision has never been this bad, she states she has gained a little bit of weight over the past year, but not too much, she states the side effects of the diamox are "horrible", her husband states she is barely eating or drinking, she states she is very tired and doesn't feel well, she has an appt with  neurology, but not until June, pt states her headaches have improved since taking the medicine, but they are worse when she lays down, she endorses ringing in her ears and she has tingling in her hands and feet since starting the medication, she states when she had the lumbar puncture she felt better, but the next day she felt worse, +TVO   Referring physician: Nira Conn, MD [address] [place],  [place] [address]  HISTORICAL INFORMATION:  Selected notes from the MEDICAL RECORD NUMBER ED f/u for optic disc edema LEE:  Ocular Hx- PMH-   CURRENT MEDICATIONS: No current outpatient medications on file. (Ophthalmic Drugs)   No current facility-administered medications for this visit. (Ophthalmic Drugs)   Current Outpatient Medications (Other)  Medication Sig   acetaZOLAMIDE (DIAMOX) 250 MG tablet Take 2 tablets (500 mg total) by mouth 2 (two) times daily.   amoxicillin (AMOXIL) 875 MG tablet Take 875 mg by mouth 2 (two) times daily.   benzonatate (TESSALON) 100 MG capsule Take 1 capsule (100 mg total) by mouth 3 (three) times daily as needed for cough. Do not take with alcohol or while driving or operating heavy machinery.  May cause drowsiness. (Patient not taking: Reported on 11/29/2022)   cloNIDine (CATAPRES) 0.1 MG tablet Take by mouth. (Patient not taking: Reported on 11/29/2022)   fexofenadine-pseudoephedrine (ALLEGRA-D) 60-120 MG 12 hr tablet Take 1 tablet by mouth every 12 (twelve) hours. (Patient not taking: Reported on  11/29/2022)   fluticasone (FLONASE) 50 MCG/ACT nasal spray Place 1 spray into both nostrils daily. (Patient not taking: Reported on 11/29/2022)   HYDROcodone-acetaminophen (NORCO/VICODIN) 5-325 MG tablet Take 1 tablet by mouth every 4 (four) hours as needed. (Patient not taking: Reported on 11/29/2022)   ondansetron (ZOFRAN-ODT) 4 MG disintegrating tablet Take 1 tablet (4 mg total) by mouth every 8 (eight) hours as needed. (Patient not taking: Reported on 11/29/2022)    No current facility-administered medications for this visit. (Other)   REVIEW OF SYSTEMS: ROS   Positive for: Eyes Negative for: Constitutional, Gastrointestinal, Neurological, Skin, Genitourinary, Musculoskeletal, HENT, Endocrine, Cardiovascular, Respiratory, Psychiatric, Allergic/Imm, Heme/Lymph Last edited by Annalee Genta D, COT on 12/04/2022  8:31 AM.     ALLERGIES Allergies  Allergen Reactions   Bactrim [Sulfamethoxazole-Trimethoprim] Hives   PAST MEDICAL HISTORY Past Medical History:  Diagnosis Date   Heart murmur    per cardiology, no murmur   Past Surgical History:  Procedure Laterality Date   NO PAST SURGERIES     FAMILY HISTORY Family History  Problem Relation Age of Onset   Diabetes Mother    Hypertension Mother    Hypertension Maternal Aunt    Hypertension Maternal Uncle    Diabetes Maternal Grandmother    Hypertension Maternal Grandmother    Alzheimer's disease Maternal Grandfather    Hypertension Cousin    SOCIAL HISTORY Social History   Tobacco Use   Smoking status: Former    Types: Cigarettes, Cigars   Smokeless tobacco: Never   Tobacco comments:    Quit 3 months ago.    Vaping Use   Vaping Use: Never used  Substance Use Topics   Alcohol use: Not Currently    Comment: occ   Drug use: Not Currently    Types: Marijuana    Comment: Last use 2022       OPHTHALMIC EXAM:  Base Eye Exam     Visual Acuity (Snellen - Linear)       Right Left   Dist [place] 20/40 20/200 -3   Dist ph  20/25 20/60 -2  Color plates OD: 09/98 OS: 0/25, could not see control plate OS        Tonometry (Tonopen, 8:48 AM)       Right Left   Pressure 11 10         Pupils       Dark Light Shape React APD   Right 3 2 Round Brisk None   Left 3 2.75 Round Minimal +2         Visual Fields (Counting fingers)       Left Right    Full Full         Extraocular Movement       Right Left    Full, Ortho Full, Ortho         Neuro/Psych      Oriented x3: Yes   Mood/Affect: Normal         Dilation     Both eyes: 1.0% Mydriacyl, 2.5% Phenylephrine @ 9:27 AM           Slit Lamp and Fundus Exam     Slit Lamp Exam       Right Left   Lids/Lashes Dermatochalasis - upper lid, Meibomian gland dysfunction Dermatochalasis - upper lid, Meibomian gland dysfunction   Conjunctiva/Sclera White and quiet White and quiet   Cornea 1+ Punctate epithelial erosions Clear   Anterior Chamber deep and clear deep and clear  Iris Round and dilated Round and dilated   Lens Clear Clear   Anterior Vitreous Normal Normal         Fundus Exam       Right Left   Disc 3-4+disc edema, +hyperema, +CWS 3-4+disc edema, +CWS, no heme   C/D Ratio 0 0   Macula Flat, Good foveal reflex Flat, Good foveal reflex, focal subfoveal SRF   Vessels mild attenuation, mild tortuosity attenuated, Tortuous   Periphery Attached, no heme Attached,  no heme           Refraction     Manifest Refraction (Auto)       Sphere Cylinder Axis Dist VA   Right -2.50 +0.75 025 20/20-1   Left -2.00 +0.50 150 20/50-1           IMAGING AND PROCEDURES  Imaging and Procedures for 12/04/2022  OCT, Retina - OU - Both Eyes       Right Eye Quality was good. Central Foveal Thickness: 281. Progression has no prior data. Findings include normal foveal contour, no IRF, no SRF (Massive disc edema).   Left Eye Quality was good. Central Foveal Thickness: 317. Progression has no prior data. Findings include normal foveal contour, no IRF, subretinal fluid (Massive disc edema; focal subfoveal SRF).   Notes *Images captured and stored on drive  Diagnosis / Impression:  OD: Massive disc edema OS: Massive disc edema; focal subfoveal SRF  Clinical management:  See below  Abbreviations: NFP - Normal foveal profile. CME - cystoid macular edema. PED - pigment epithelial detachment. IRF - intraretinal fluid. SRF - subretinal fluid. EZ - ellipsoid zone. ERM - epiretinal  membrane. ORA - outer retinal atrophy. ORT - outer retinal tubulation. SRHM - subretinal hyper-reflective material. IRHM - intraretinal hyper-reflective material      Humphrey Visual Field - OU - Both Eyes       Right Eye Reliability was good. Progression has no prior data.   Left Eye Reliability was good. Progression has no prior data.   Notes HVF 24-2 OU SITA-STD  OD Fixation losses: 0/14 False positive errors: 1% False negative errors: 0% Findings: enlarged blind spot (OS>OD)  OS Fixation losses: 0/17 False positive errors: 1% False negative errors: 19% Findings: enlarged blind spot (OS>OD)        MRI HEAD (04.13.24) IMPRESSION:   1. Normal brain MRI. No acute intracranial abnormality. 2. Moderate right mastoid and middle ear effusion, of uncertain significance. Correlation with physical exam recommended.   MRI ORBITS (04.13.24) IMPRESSION:   1. Flattening of the posterior globes with subtle bulging of the optic discs, consistent with papilledema. No convincing evidence for acute/active optic neuritis at this time. 2. Ethmoidal and maxillary sinusitis.      ASSESSMENT/PLAN:   ICD-10-CM   1. IIH (idiopathic intracranial hypertension)  G93.2 OCT, Retina - OU - Both Eyes    Humphrey Visual Field - OU - Both Eyes    2. 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 Visual field scotoma of both eyes  H53.413 Humphrey Visual Field - OU - Both Eyes     1-3. Idiopathic Intracranial hypertension   - 21 yo pt with a several week history of positional headaches -- worse while supine  - +blurred vision, +transient visual obscurations, +pulsatile tinnitus; +nausea; denies diplopia, vomiting  - pt reports recent weight gain  - multiple visits to [facility] ED, then presented to [facility] ED on 4.13.24 with persistent  headache  - MRI brain and orbits 04.13.24 showed +papilledema but no evidence of active optic neuritis; normal brain MRI  - LP  performed in ED with elevated opening pressure (42 cmH2O) and pt had relief of her symptoms for ~24 hours following LP  - started on po Diamox 500 mg BID and referred for outpt f/u w/ Ophthalmology and Neurology -- Neurology f/u scheduled for June  - pt reports headaches have improved since starting diamox but still present in supine position; +numbness and tingling in hands/fingers since starting diamox  - exam here shows decreased vision OU (BCVA OD 20/20, OS 20/50 via refraction)  - OS with decreased color vision and +rAPD -- significant optic neuropathy OS  - 3-4+ disc edema OU (OS > OD) on exam and OCT  - HVF shows enlarged blind spots OU (OS > OD) consistent w/ disc edema / IIH  - discussed case with Dr. Delena Bali of [facility] and Neurology f/u moved to Monday, 04.22.24  - recommend increasing diamox to  TID  - f/u here in 1-2 wks -- DFE/OCT, possible repeat HVF  Ophthalmic Meds Ordered this visit:  No orders of the defined types were placed in this encounter.    Return for 1-2 wks, Dilated Exam, OCT, possible repeat HVF.  There are no Patient Instructions on file for this visit.  Explained the diagnoses, plan, and follow up with the patient and they expressed understanding.  Patient expressed understanding of the importance of proper follow up care.   This document serves as a record of services personally performed by Karie Chimera, MD, PhD. It was created on their behalf by Glee Arvin. Manson Passey, OA an ophthalmic technician. The creation of this record is the provider's dictation and/or activities during the visit.    Electronically signed by: Glee Arvin. Kristopher Oppenheim 04.15.2024 12:53 PM  Karie Chimera, M.D., Ph.D. Diseases & Surgery of the Retina and Vitreous [facility] 12/04/2022  I have reviewed the above documentation for accuracy and completeness, and I agree with the above. Karie Chimera, M.D., Ph.D. 12/04/22 12:53  PM   Abbreviations: M myopia (nearsighted); A astigmatism; H hyperopia (farsighted); P presbyopia; Mrx spectacle prescription;  CTL contact lenses; OD right eye; OS left eye; OU both eyes  XT exotropia; ET esotropia; PEK punctate epithelial keratitis; PEE punctate epithelial erosions; DES dry eye syndrome; MGD meibomian gland dysfunction; ATs artificial tears; PFAT's preservative free artificial tears; NSC nuclear sclerotic cataract; PSC posterior subcapsular cataract; ERM epi-retinal membrane; PVD posterior vitreous detachment; RD retinal detachment; DM diabetes mellitus; DR diabetic retinopathy; NPDR non-proliferative diabetic retinopathy; PDR proliferative diabetic retinopathy; CSME clinically significant macular edema; DME diabetic macular edema; dbh dot blot hemorrhages; CWS cotton wool spot; POAG primary open angle glaucoma; C/D cup-to-disc ratio; HVF humphrey visual field; GVF goldmann visual field; OCT optical coherence tomography; IOP intraocular pressure; BRVO Branch retinal vein occlusion; CRVO central retinal vein occlusion; CRAO central retinal artery occlusion; BRAO branch retinal artery occlusion; RT retinal tear; SB scleral buckle; PPV pars plana vitrectomy; VH Vitreous hemorrhage; PRP panretinal laser photocoagulation; IVK intravitreal kenalog; VMT vitreomacular traction; MH Macular hole;  NVD neovascularization of the disc; NVE neovascularization elsewhere; AREDS age related eye disease study; ARMD age related macular degeneration; POAG primary open angle glaucoma; EBMD epithelial/anterior basement membrane dystrophy; ACIOL anterior chamber intraocular lens; IOL intraocular lens; PCIOL posterior chamber intraocular lens; Phaco/IOL phacoemulsification with intraocular lens placement; PRK photorefractive keratectomy; LASIK laser assisted in situ keratomileusis; HTN  hypertension; DM diabetes mellitus; COPD chronic obstructive pulmonary disease

## 2022-12-04 ENCOUNTER — Ambulatory Visit (INDEPENDENT_AMBULATORY_CARE_PROVIDER_SITE_OTHER): Payer: Medicaid Other | Admitting: Ophthalmology

## 2022-12-04 ENCOUNTER — Encounter (INDEPENDENT_AMBULATORY_CARE_PROVIDER_SITE_OTHER): Payer: Self-pay | Admitting: Ophthalmology

## 2022-12-04 VITALS — BP 109/70 | HR 76

## 2022-12-04 DIAGNOSIS — G932 Benign intracranial hypertension: Secondary | ICD-10-CM | POA: Diagnosis not present

## 2022-12-04 DIAGNOSIS — H4710 Unspecified papilledema: Secondary | ICD-10-CM | POA: Diagnosis not present

## 2022-12-04 DIAGNOSIS — H3581 Retinal edema: Secondary | ICD-10-CM

## 2022-12-04 DIAGNOSIS — H53413 Scotoma involving central area, bilateral: Secondary | ICD-10-CM | POA: Diagnosis not present

## 2022-12-08 ENCOUNTER — Telehealth: Payer: Self-pay | Admitting: Neurology

## 2022-12-08 ENCOUNTER — Encounter: Payer: Self-pay | Admitting: Neurology

## 2022-12-08 ENCOUNTER — Ambulatory Visit: Payer: Medicaid Other | Admitting: Neurology

## 2022-12-08 VITALS — BP 100/58 | Ht 62.0 in | Wt 214.0 lb

## 2022-12-08 DIAGNOSIS — R0681 Apnea, not elsewhere classified: Secondary | ICD-10-CM

## 2022-12-08 DIAGNOSIS — H4711 Papilledema associated with increased intracranial pressure: Secondary | ICD-10-CM

## 2022-12-08 DIAGNOSIS — H5462 Unqualified visual loss, left eye, normal vision right eye: Secondary | ICD-10-CM | POA: Diagnosis not present

## 2022-12-08 DIAGNOSIS — G932 Benign intracranial hypertension: Secondary | ICD-10-CM

## 2022-12-08 DIAGNOSIS — E669 Obesity, unspecified: Secondary | ICD-10-CM

## 2022-12-08 DIAGNOSIS — G4719 Other hypersomnia: Secondary | ICD-10-CM

## 2022-12-08 DIAGNOSIS — R0683 Snoring: Secondary | ICD-10-CM

## 2022-12-08 MED ORDER — ACETAZOLAMIDE ER 500 MG PO CP12: ORAL_CAPSULE | ORAL | 3 refills | Status: AC

## 2022-12-08 NOTE — Telephone Encounter (Signed)
Sent to [facility] [phone]

## 2022-12-08 NOTE — Patient Instructions (Addendum)
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  I would like to suggest a few things today:  1.  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  I recommend that you go to [facility] emergency room as they have ophthalmology doctors that could offer you a procedure to relieve the pressure off your eye nerves. 2.  You will need to continue to have a regular eye examination through your ophthalmologist.  You will need regular checkup with a full, dilated, eye exam including visual field testing, to see if you have had any loss of peripheral vision or improvement in your.  3.  You may need another lumbar puncture, they may do 1 at [facility] emergency room today.  4.  As you know, your vision and visual field can be affected. The most serious complication of having pseudotumor cerebri is loss of vision, which can be permanent. Work up, at least initially with the eye specialist should include: best corrected visual acuity, formal visual field testing, dilated fundus examination with optic disc photographs, and often optical coherence tomography (OCT) of the optic nerve, retinal nerve fiber layer, and macular ganglion cell layer. Worsening vision is an indication for intensifying treatment.  6.  Ultimately, you may need to be considered for a shunt in the future (to prevent fluid pressure from building up over and over). 7. We will also do a sleep study to rule out obstructive sleep apnea.  If you have obstructive sleep apnea, I will want you to start treatment with a machine called CPAP or AutoPAP.  Treatment of obstructive  sleep apnea can help headaches, also help with metabolism and weight loss, and ultimately, treatment of sleep apnea can reduce risk for cardiovascular complications including heart disease and stroke risk. 8.  I highly recommend that you continue with Diamox 500 mg strength, take 1 pill in the morning and 2 at bedtime.  There are not very many medications that can work to reduce the fluid pressure. 9.  Continue to work on weight loss.  Avoid caffeine.

## 2022-12-08 NOTE — Progress Notes (Signed)
Subjective:    Patient ID: Ana Gordon is a 22 y.o. female.  HPI    Huston Foley, MD, PhD [facility] [address] [address] [place], [place] [address]  I saw patient, Ana Gordon, as a referral from the emergency room for recurrent headaches.  The patient is accompanied by her husband and young son today. Ana Gordon is a 22 year old female with an underlying medical history of recurrent headaches, blurry vision, allergies, and obesity, who reports a few month history of vision loss affecting primarily her left eye.  Sometimes she sees only half the picture and sometimes she sees better.  She has had headaches including morning headaches.  She does not wake up rested and endorses daytime somnolence, visual disturbance in both eyes and recurrent headaches throughout the day.  She denies any sudden onset of one-sided weakness or numbness or tingling or droopy face or slurring of speech.  She presented to the emergency room on 11/29/2022 with recurrent headaches and blurry vision.  She had a lumbar puncture which showed an opening pressure of 41.5 cm.  She was started on Diamox twice daily 500 mg strength.  I reviewed the emergency room records.  She had a brain MRI with and without contrast as well as orbital MRI with and without contrast through the emergency room on 11/29/2022 and I reviewed the results:  IMPRESSION: MRI HEAD IMPRESSION:   1. Normal brain MRI. No acute intracranial abnormality. 2. Moderate right mastoid and middle ear effusion, of uncertain significance. Correlation with physical exam recommended.   MRI ORBITS IMPRESSION:   1. Flattening of the posterior globes with subtle bulging of the optic discs, consistent with papilledema. No convincing evidence for acute/active optic neuritis at this time. 2. Ethmoidal and maxillary sinusitis.  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  She was recently seen by optometry and ophthalmology on  12/04/2022 and I reviewed the note.  She was found to have significant papilledema bilaterally, left more than right.  She had decreased vision bilaterally, she had increased blind spots bilaterally, left more than right. She was advised to increase her Diamox to 500 mg 3 times daily but she did report significant side effects from the Diamox including change in taste and paresthesias.    She currently takes 500 mg in the morning and 500 mg at bedtime but reports that she finds the side effects very difficult to tolerate, she is not taking it 3 times a day at this point.  She does not sleep well.  She snores and stops breathing in her sleep her husband, she has woken up with a sense of gasping for air.  She has nocturia about 2 or 3 times per night.  She does not drink caffeine.  Her Past Medical History Is Significant For: Past Medical History:  Diagnosis Date   Heart murmur    per cardiology, no murmur   Seizures     Her Past Surgical History Is Significant For: Past Surgical History:  Procedure Laterality Date   NO PAST SURGERIES      Her Family History Is Significant For: Family History  Problem Relation Age of Onset   Diabetes Mother    Hypertension Mother    Hypertension Maternal Aunt    Hypertension Maternal Uncle    Diabetes Maternal Grandmother    Hypertension Maternal Grandmother    Alzheimer's disease Maternal Grandfather    Hypertension Cousin     Her Social History Is Significant For: Social History  Socioeconomic History   Marital status: Single    Spouse name: tyrik   Number of children: 1   Years of education: 9   Highest education level: Not on file  Occupational History   Not on file  Tobacco Use   Smoking status: Former    Types: Cigarettes, Cigars   Smokeless tobacco: Never   Tobacco comments:    Quit 3 months ago.    Vaping Use   Vaping Use: Never used  Substance and Sexual Activity   Alcohol use: Not Currently    Comment: occ   Drug use: Not  Currently    Types: Marijuana    Comment: Last use 2022   Sexual activity: Yes    Birth control/protection: None  Other Topics Concern   Not on file  Social History Narrative   Lives with fiance and  two children.  16 year old and 3 year old   Caffeine-occasional tea   Works in [employer]   Social Determinants of Health   Financial Resource Strain: Low Risk  (06/22/2018)   Overall Financial Resource Strain (CARDIA)    Difficulty of Paying Living Expenses: Not very hard  Food Insecurity: No Food Insecurity (06/22/2018)   Hunger Vital Sign    Worried About Running Out of Food in the Last Year: Never true    Ran Out of Food in the Last Year: Never true  Transportation Needs: No Transportation Needs (06/22/2018)   PRAPARE - Administrator, Civil Service (Medical): No    Lack of Transportation (Non-Medical): No  Physical Activity: Insufficiently Active (06/22/2018)   Exercise Vital Sign    Days of Exercise per Week: 3 days    Minutes of Exercise per Session: 20 min  Stress: No Stress Concern Present (06/22/2018)   [employer] of Occupational Health - Occupational Stress Questionnaire    Feeling of Stress : Only a little  Social Connections: Moderately Integrated (06/22/2018)   Social Connection and Isolation Panel [NHANES]    Frequency of Communication with Friends and Family: Three times a week    Frequency of Social Gatherings with Friends and Family: Twice a week    Attends Religious Services: More than 4 times per year    Active Member of [employer] or Organizations: No    Attends Banker Meetings: Never    Marital Status: Living with partner    Her Allergies Are:  Allergies  Allergen Reactions   Bactrim [Sulfamethoxazole-Trimethoprim] Hives  :   Her Current Medications Are:  Outpatient Encounter Medications as of 12/08/2022  Medication Sig   acetaZOLAMIDE (DIAMOX) 250 MG tablet Take 2 tablets (500 mg total) by mouth 2 (two) times daily.    amoxicillin (AMOXIL) 875 MG tablet Take 875 mg by mouth 2 (two) times daily. (Patient not taking: Reported on 12/08/2022)   benzonatate (TESSALON) 100 MG capsule Take 1 capsule (100 mg total) by mouth 3 (three) times daily as needed for cough. Do not take with alcohol or while driving or operating heavy machinery.  May cause drowsiness. (Patient not taking: Reported on 11/29/2022)   cloNIDine (CATAPRES) 0.1 MG tablet Take by mouth. (Patient not taking: Reported on 11/29/2022)   fexofenadine-pseudoephedrine (ALLEGRA-D) 60-120 MG 12 hr tablet Take 1 tablet by mouth every 12 (twelve) hours. (Patient not taking: Reported on 11/29/2022)   fluticasone (FLONASE) 50 MCG/ACT nasal spray Place 1 spray into both nostrils daily. (Patient not taking: Reported on 11/29/2022)   HYDROcodone-acetaminophen (NORCO/VICODIN) 5-325 MG tablet Take  1 tablet by mouth every 4 (four) hours as needed. (Patient not taking: Reported on 11/29/2022)   ondansetron (ZOFRAN-ODT) 4 MG disintegrating tablet Take 1 tablet (4 mg total) by mouth every 8 (eight) hours as needed. (Patient not taking: Reported on 11/29/2022)   [DISCONTINUED] LO LOESTRIN FE 1 MG-10 MCG / 10 MCG tablet Take 1 tablet by mouth daily. (Patient not taking: Reported on 12/06/2019)   No facility-administered encounter medications on file as of 12/08/2022.  :   Review of Systems:  Out of a complete 14 point review of systems, all are reviewed and negative with the exception of these symptoms as listed below:  Review of Systems  Neurological:        ED visit 11/29/22 lightheaded and bad headache, vision loss. Lumbar punture and MRI in ED. 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 Increased diamox 250mg  6 tabs daily.  Unable to tolerate increased Diamox due to side effects (shortness of breath, altered taste, tingling in feet, constant ringing in ears)    Objective:  Neurological Exam  Physical Exam Physical Examination:    Vitals:   12/08/22 0917  BP: (!) 100/58    General Examination: The patient is a very pleasant 22 y.o. female in no acute distress. She appears well-developed and well-nourished and adequately groomed.   HEENT: Normocephalic, atraumatic, pupils are equal, round and reactive to light, significant papilledema bilaterally, left more than right, fairly florid. Extraocular tracking is good without limitation to gaze excursion or nystagmus noted. Hearing is grossly intact. Face is symmetric with normal facial animation. Speech is clear with no dysarthria noted. There is no hypophonia. There is no lip, neck/head, jaw or voice tremor. Neck is supple with full range of passive and active motion. There are no carotid bruits on auscultation. Oropharynx exam reveals: mild mouth dryness, adequate dental hygiene and mild airway crowding.   Chest: Clear to auscultation without wheezing, rhonchi or crackles noted.  Heart: S1+S2+0, regular and normal without murmurs, rubs or gallops noted.   Abdomen: Soft, non-tender and non-distended.  Extremities: There is no pitting edema in the distal lower extremities bilaterally.   Skin: Warm and dry without trophic changes noted.   Musculoskeletal: exam reveals no obvious joint deformities.   Neurologically:  Mental status: The patient is awake, alert and oriented in all 4 spheres. Her immediate and remote memory, attention, language skills and fund of knowledge are appropriate. There is no evidence of aphasia, agnosia, apraxia or anomia. Speech is clear with normal prosody and enunciation. Thought process is linear. Mood is normal and affect is normal.  Cranial nerves II - XII are as described above under HEENT exam.  Motor exam: Normal bulk, strength and tone is noted. There is no obvious action or resting tremor.  Fine motor skills and coordination: grossly intact.  Cerebellar testing: No dysmetria or intention tremor. There is no truncal or gait ataxia.   Sensory exam: intact to light touch in the upper and lower extremities.  Gait, station and balance: She stands easily. No veering to one side is noted. No leaning to one side is noted. Posture is age-appropriate and stance is narrow based. Gait shows normal stride length and normal pace. No problems turning are noted.  Reflexes are 1-2+ throughout, toes are downgoing bilaterally.  Assessment and Plan:   In summary, Christean Silvestri is a 22 y.o.-year old female with an underlying medical history of recurrent headaches, blurry vision, allergies, and obesity, who presents  as a referral from the emergency room for papilledema.  She had a lumbar puncture less than 2 weeks ago which showed an opening pressure of over 41 cm.  She has papilledema bilaterally, left more than right and has already experienced vision loss.  This is quite an urgent situation, medication management alone will unlikely be enough for her.  She is not taking her Diamox as prescribed.  She is advised to continue to take Diamox despite current side effects if at all possible at 500 mg in the morning and 1000 mg at bedtime.  I adjusted her prescription.  I had an extended conversation with the patient with extended counseling regarding the possible sinister outcome of vision loss which could be permanent in her condition.  She is advised to proceed to the emergency room at [facility] and get evaluated again for a possible lumbar puncture but more importantly for an intervention such as optic nerve fenestration and we also discussed the possibility that she may benefit from a shunt placement eventually.  There are not very many medications that can help to reduce the fluid pressure in a significant and timely fashion to help her vision at this time.  She is advised to proceed with a home sleep test to evaluate for sleep apnea as she may be at risk.  If she has obstructive sleep apnea would like for her to be treated with an AutoPap machine.   She is advised that we will schedule her home sleep test once we have insurance approval.  She and her husband had several questions and I answered them to the best of my abilities.  Ultimately, they did agree to go to the emergency room today at [facility], her husband will take her.  I will also put a referral in for [facility] ophthalmology for consideration of optic nerve fenestration but I think her situation is more urgent than a regular outpatient referral would allow.  Huston Foley, MD, PhD

## 2022-12-11 IMAGING — DX DG CHEST 2V
2 series · 2 of 2 positions shown · non-contrast
Comparison: 04/09/2021

CLINICAL DATA: Chest pain and shortness of breath 2 days.

EXAM:
CHEST - 2 VIEW

[chest pa]
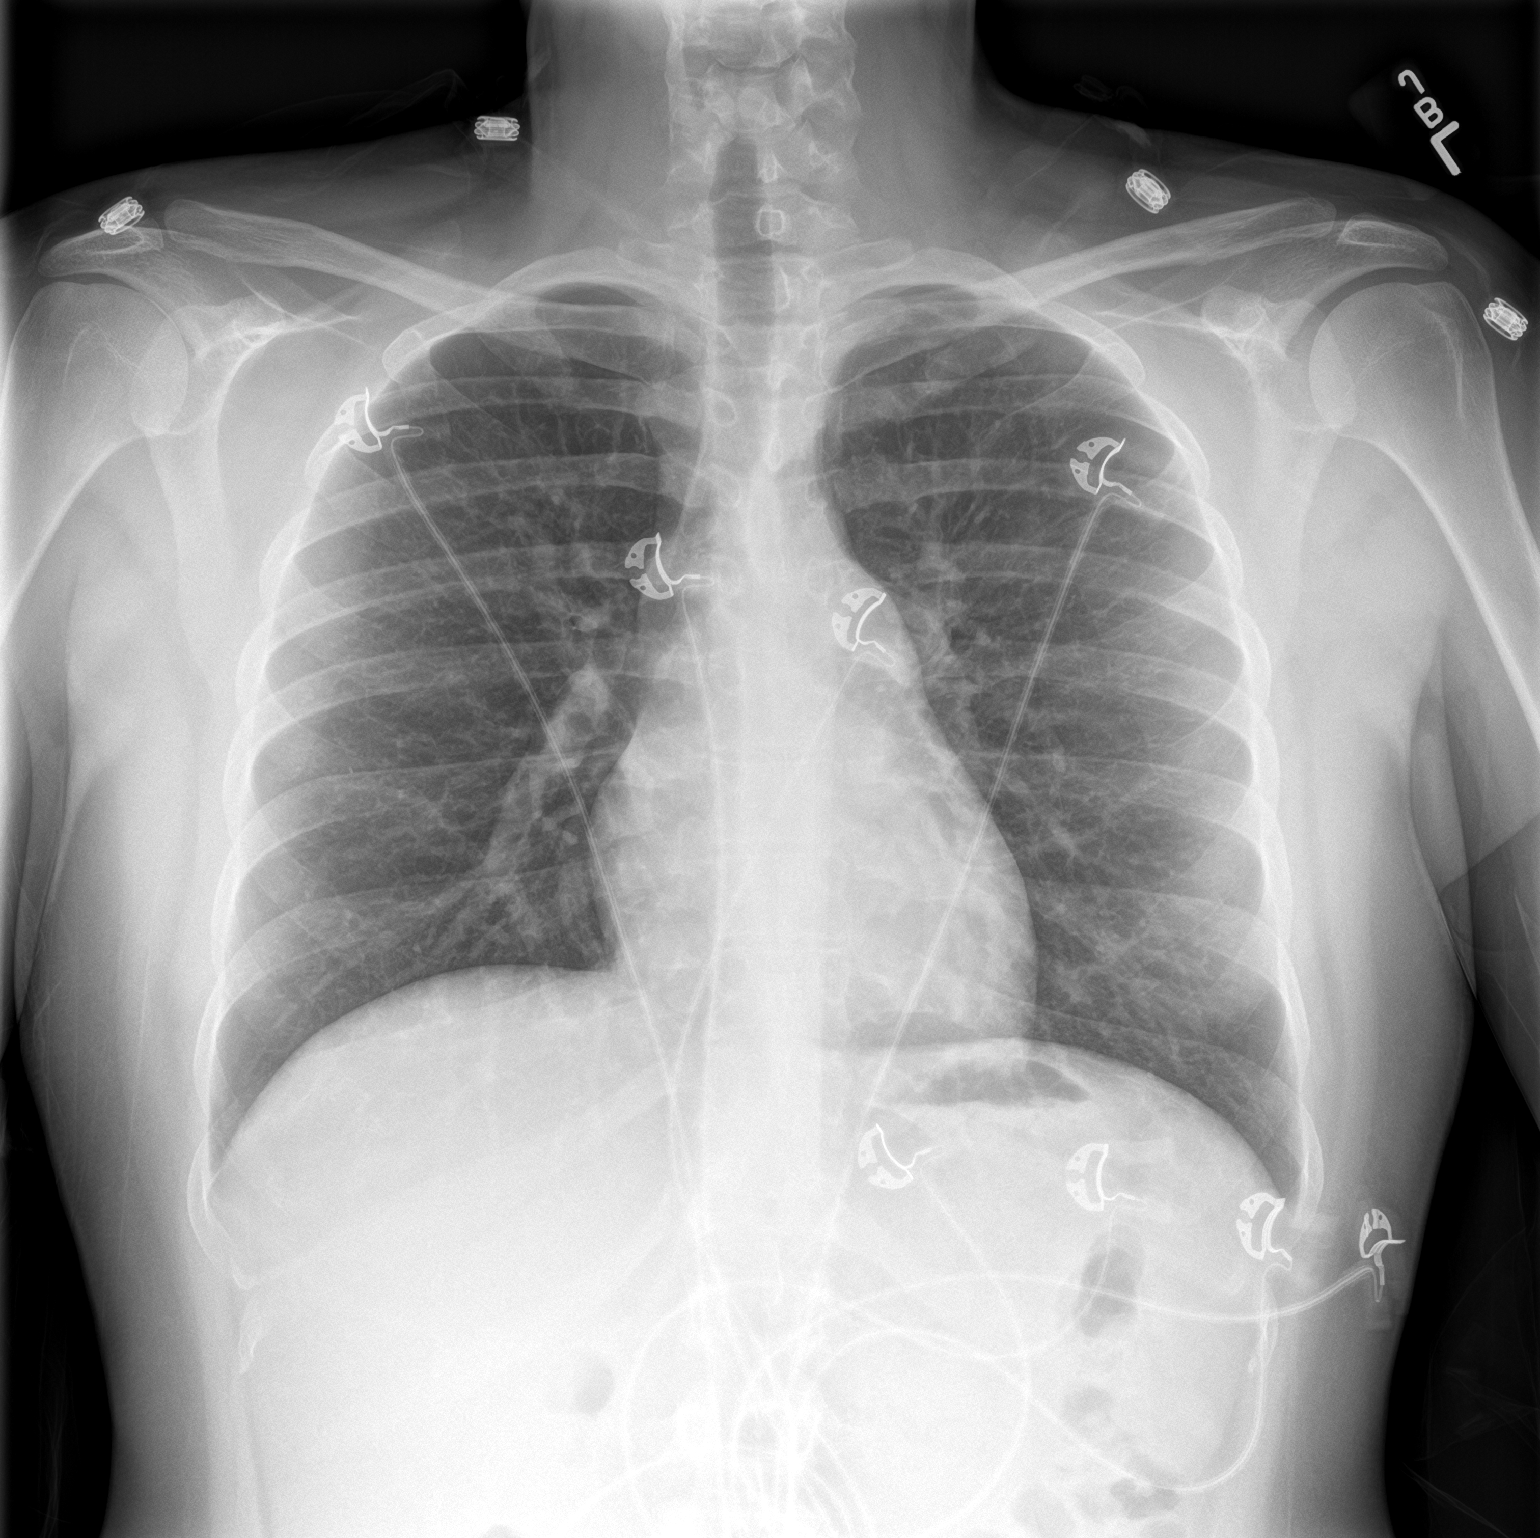

[chest lat]
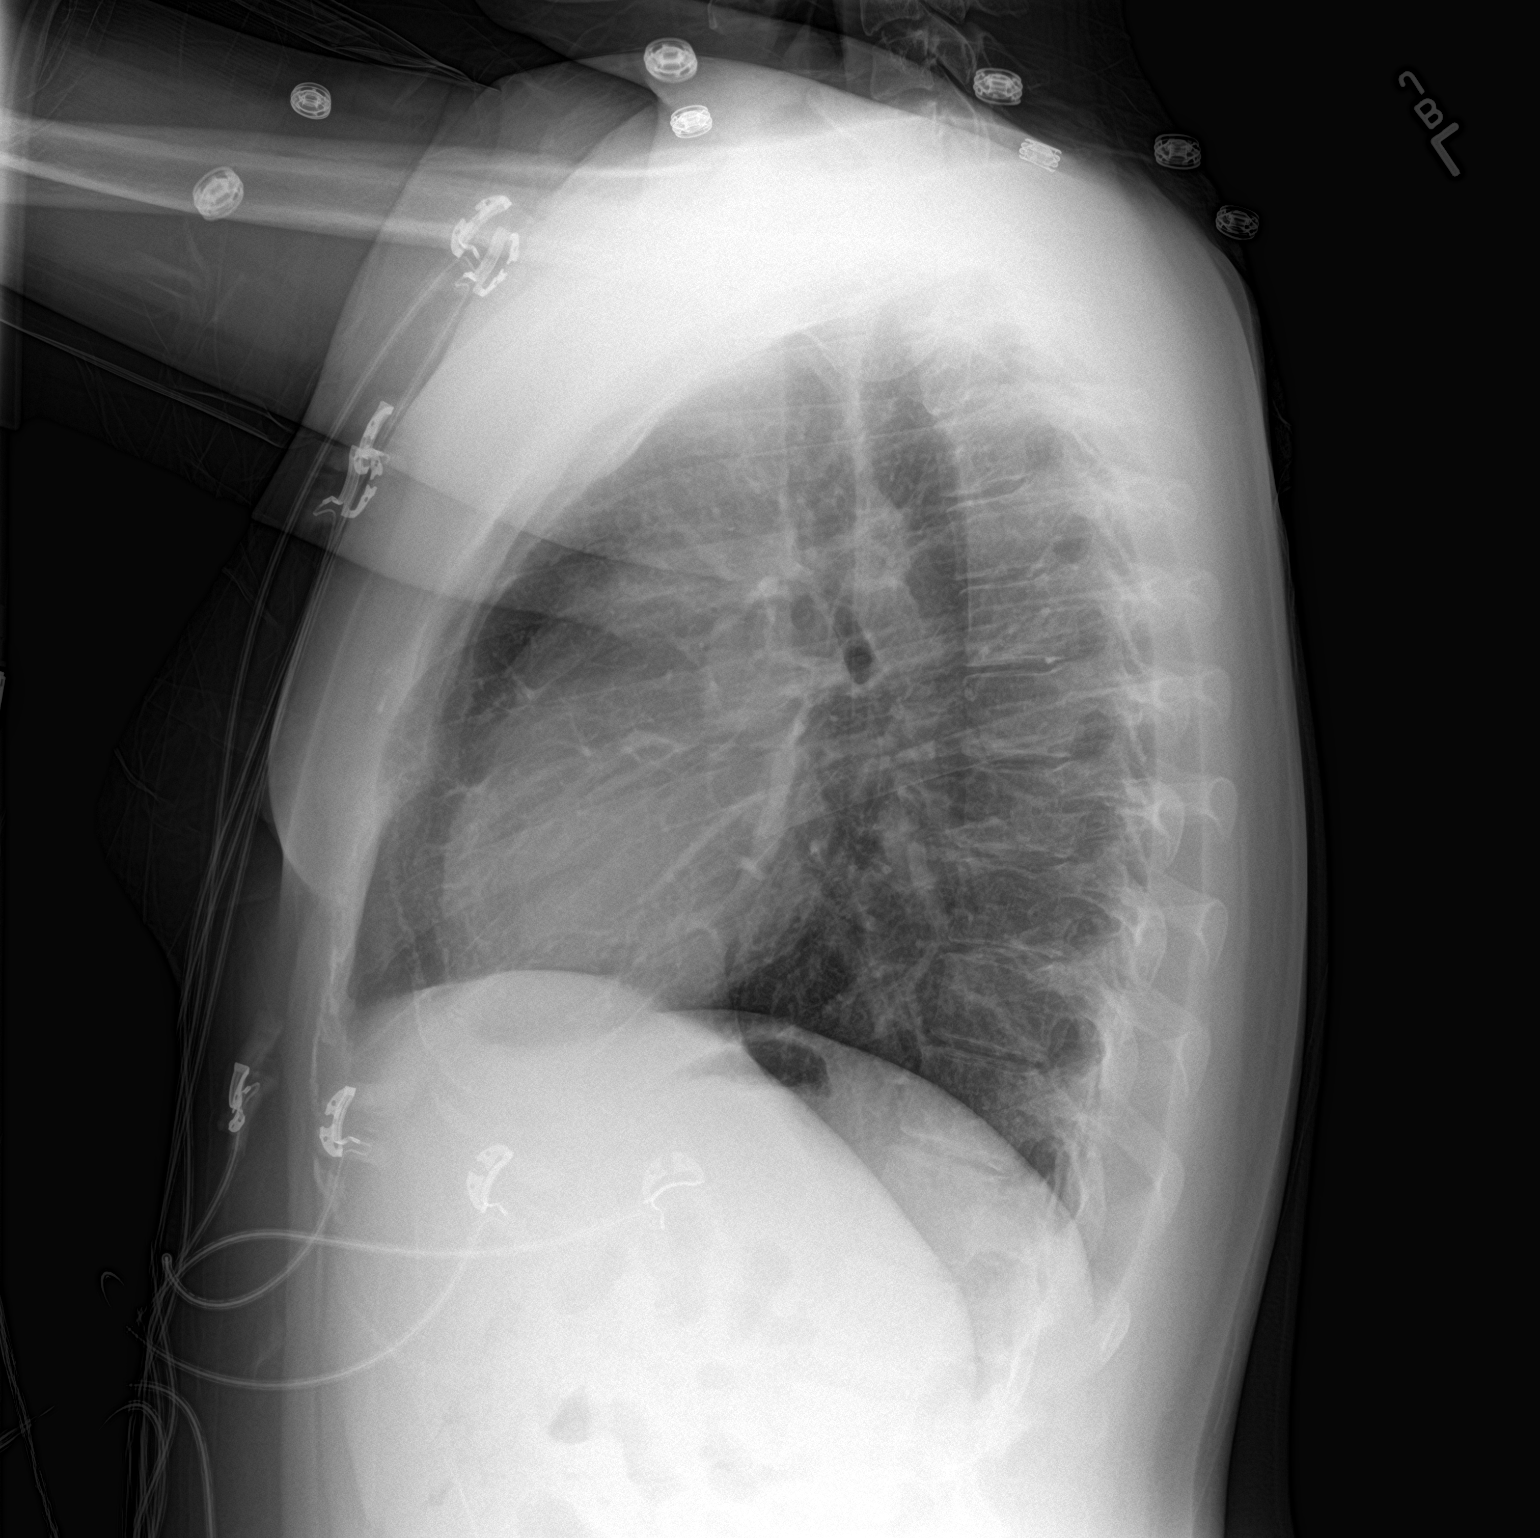

[2 of 2 positions shown; findings below may reference images not displayed]

FINDINGS: Lungs are adequately inflated and otherwise clear. Cardiac
silhouette is normal. Mild stable prominence of the main pulmonary
artery segment. Bones and soft tissues are normal.
IMPRESSION: No active cardiopulmonary disease.

## 2022-12-15 ENCOUNTER — Telehealth: Payer: Self-pay | Admitting: Neurology

## 2022-12-15 NOTE — Telephone Encounter (Signed)
[facility] pending uploaded notes.

## 2022-12-17 ENCOUNTER — Encounter (INDEPENDENT_AMBULATORY_CARE_PROVIDER_SITE_OTHER): Payer: Medicaid Other | Admitting: Ophthalmology

## 2022-12-17 DIAGNOSIS — H4710 Unspecified papilledema: Secondary | ICD-10-CM

## 2022-12-17 DIAGNOSIS — H53413 Scotoma involving central area, bilateral: Secondary | ICD-10-CM

## 2022-12-17 DIAGNOSIS — G932 Benign intracranial hypertension: Secondary | ICD-10-CM

## 2022-12-24 NOTE — Telephone Encounter (Signed)
12/24/22 No VM [place] 12/16/22 [facility] no auth req via fax form [facility]

## 2023-01-09 IMAGING — CT CT ABD-PELV W/ CM
2 of 4 series · 15 of 46 positions shown, 17 images · IV contrast (omnipaque)
Comparison: CT 02/01/2018
COMPARISON: CT 02/01/2018

Addendum:
CLINICAL DATA: Right lower quadrant pain

EXAM:
CT ABDOMEN AND PELVIS WITH CONTRAST
TECHNIQUE: Multidetector CT imaging of the abdomen and pelvis was performed
using the standard protocol following bolus administration of
intravenous contrast.
CONTRAST:  100mL OMNIPAQUE IOHEXOL 300 MG/ML  SOLN

[Series 2: axial st · axial · 0.70mm/px · z∈[+1200,+1610]mm · 12 of 90 slices shown, 14 images]
[im 4/90  soft-tissue]
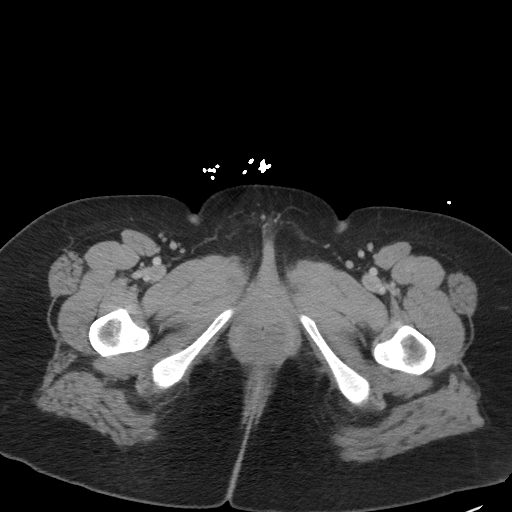
[im 4/90  bone]
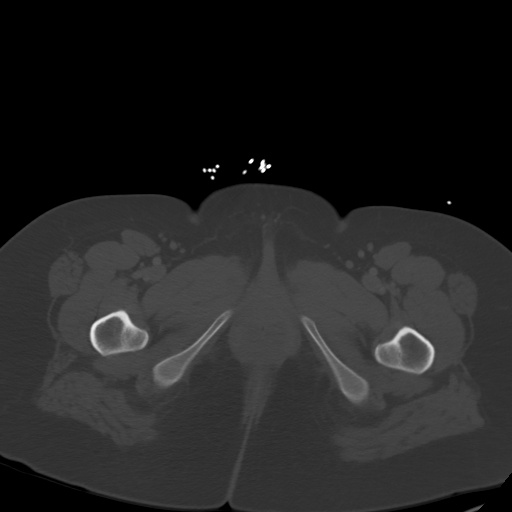
[im 12/90  soft-tissue]
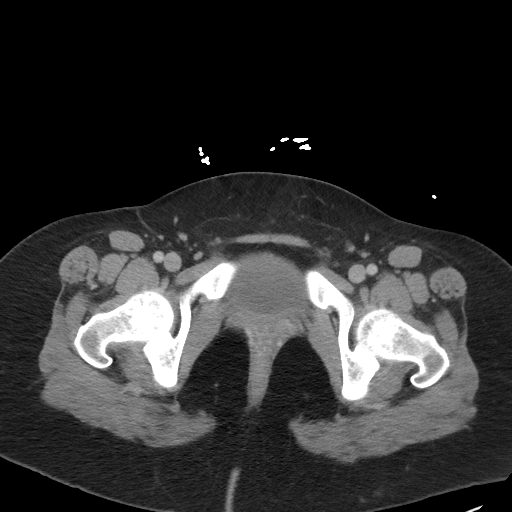
[im 20/90  soft-tissue]
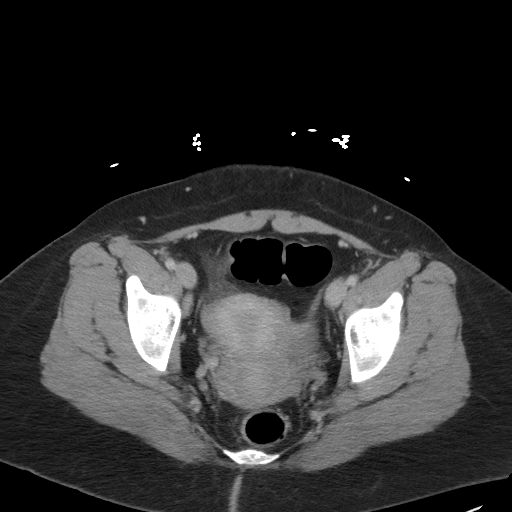
[im 28/90  soft-tissue]
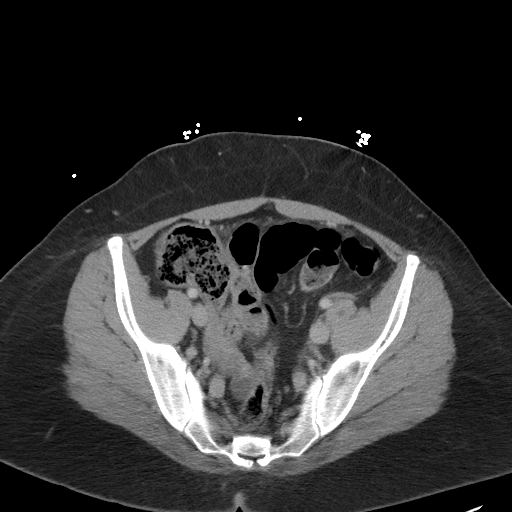
[im 35/90  soft-tissue]
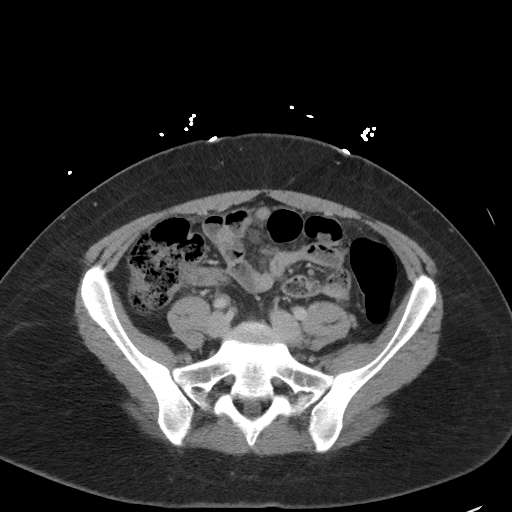
[im 43/90  soft-tissue]
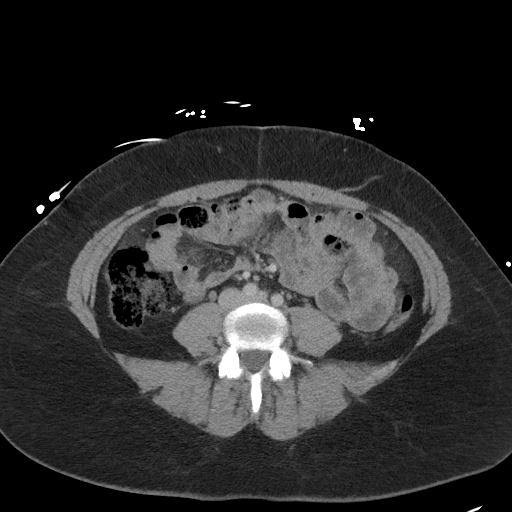
[im 47/90  soft-tissue]
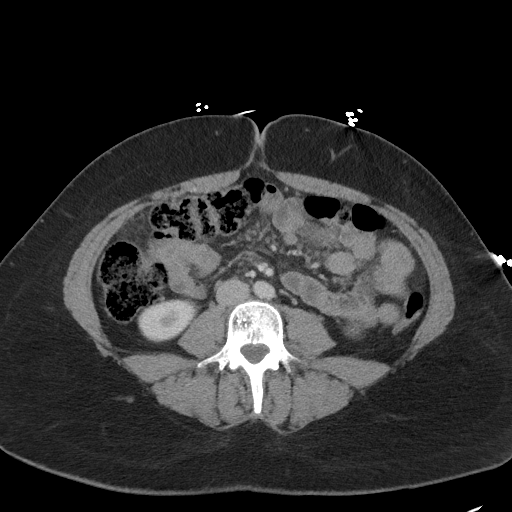
[im 55/90  soft-tissue]
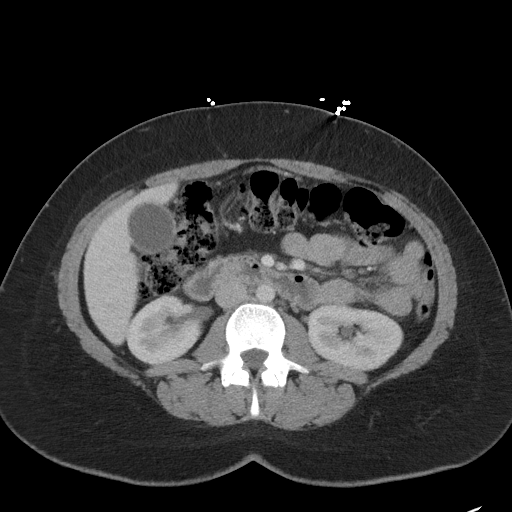
[im 62/90  soft-tissue]
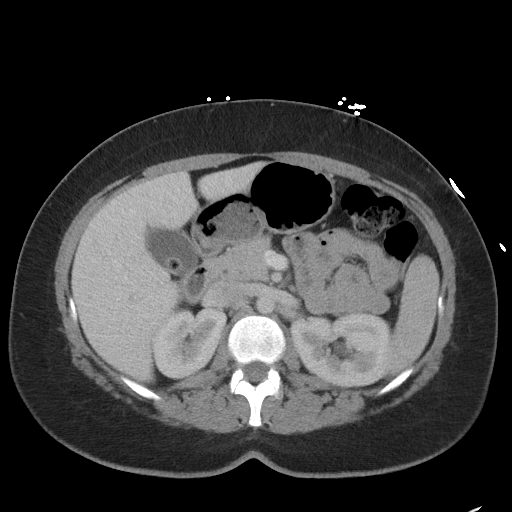
[im 62/90  bone]
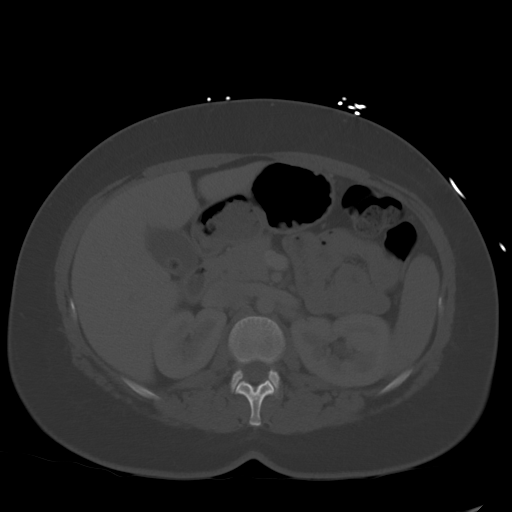
[im 70/90  soft-tissue]
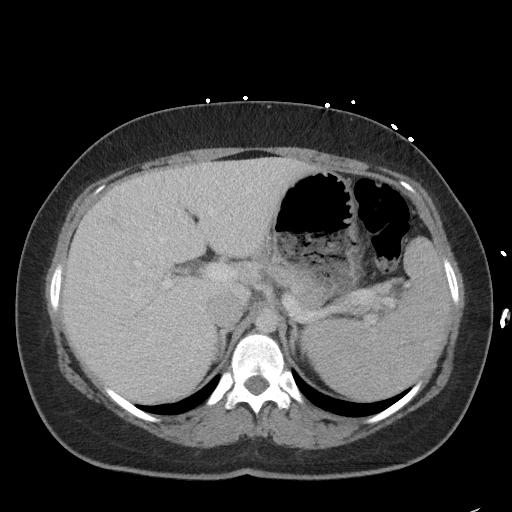
[im 78/90  soft-tissue]
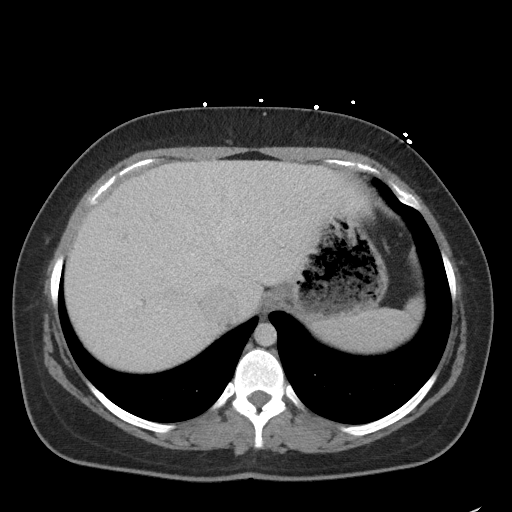
[im 86/90  soft-tissue]
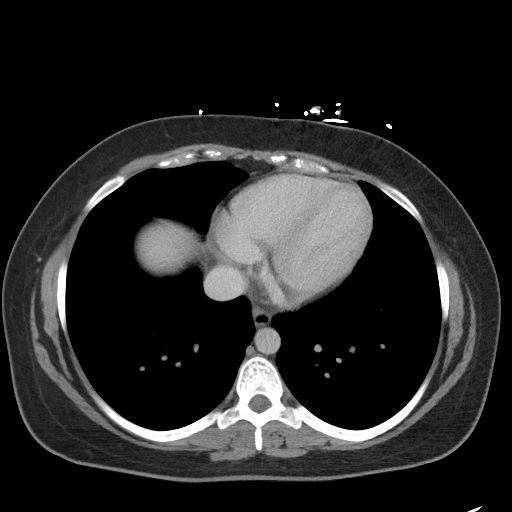

[Series 5: coronal st · coronal · 0.78mm/px · 3 of 84 slices shown]
[im 28/84  soft-tissue]
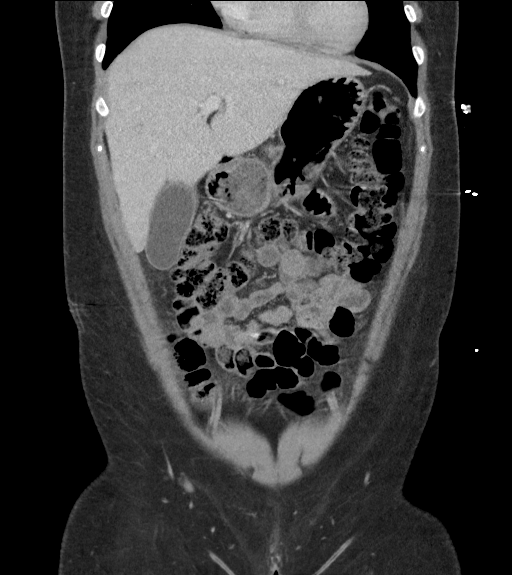
[im 37/84  soft-tissue]
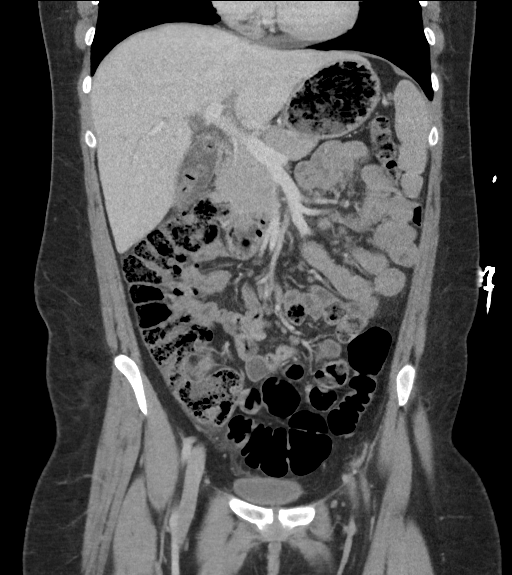
[im 47/84  soft-tissue]
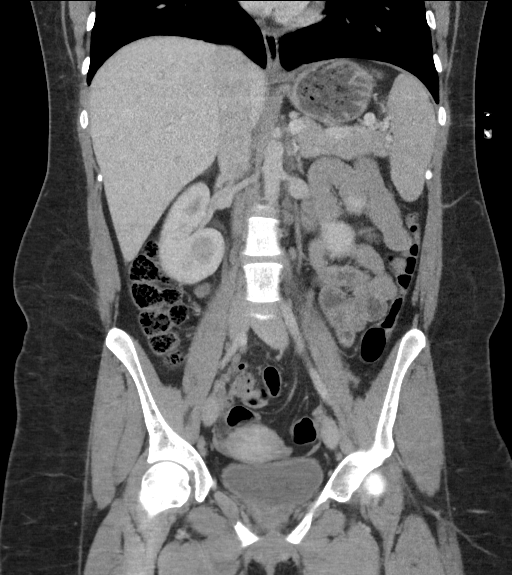

[15 of 46 positions shown; findings below may reference images not displayed]

FINDINGS: Lower chest: No acute abnormality.

Hepatobiliary: Multiple gallstones. No biliary dilatation. No focal
hepatic abnormality.

Pancreas: Unremarkable. No pancreatic ductal dilatation or
surrounding inflammatory changes.

Spleen: Normal in size without focal abnormality.

Adrenals/Urinary Tract: Adrenal glands are unremarkable. Kidneys are
normal, without renal calculi, focal lesion, or hydronephrosis.
Bladder is unremarkable.

Stomach/Bowel: Stomach is within normal limits. Appendix appears
normal. No evidence of bowel wall thickening, distention, or
inflammatory changes.

Vascular/Lymphatic: No significant vascular findings are present. No
enlarged abdominal or pelvic lymph nodes.

Reproductive: Uterus and bilateral adnexa are unremarkable.

Other: Small free fluid in the pelvis. Mild hazy edema and soft
tissue stranding in the right lower quadrant adjacent to the cecum.

Musculoskeletal: No acute or significant osseous findings.
IMPRESSION: 1. Negative for acute appendicitis.
2. Small free fluid in the pelvis. There is mild edema/soft tissue
stranding in the right lower quadrant adjacent to the cecum but no
adjacent wall thickening. Findings could be due to nonspecific
pelvic inflammatory process. There is no evidence for abscess.

ADDENDUM:
Additional impression: Cholelithiasis without inflammatory process
in the right upper quadrant

*** End of Addendum ***
FINDINGS: Lower chest: No acute abnormality.

Hepatobiliary: Multiple gallstones. No biliary dilatation. No focal
hepatic abnormality.

Pancreas: Unremarkable. No pancreatic ductal dilatation or
surrounding inflammatory changes.

Spleen: Normal in size without focal abnormality.

Adrenals/Urinary Tract: Adrenal glands are unremarkable. Kidneys are
normal, without renal calculi, focal lesion, or hydronephrosis.
Bladder is unremarkable.

Stomach/Bowel: Stomach is within normal limits. Appendix appears
normal. No evidence of bowel wall thickening, distention, or
inflammatory changes.

Vascular/Lymphatic: No significant vascular findings are present. No
enlarged abdominal or pelvic lymph nodes.

Reproductive: Uterus and bilateral adnexa are unremarkable.

Other: Small free fluid in the pelvis. Mild hazy edema and soft
tissue stranding in the right lower quadrant adjacent to the cecum.

Musculoskeletal: No acute or significant osseous findings.
IMPRESSION: 1. Negative for acute appendicitis.
2. Small free fluid in the pelvis. There is mild edema/soft tissue
stranding in the right lower quadrant adjacent to the cecum but no
adjacent wall thickening. Findings could be due to nonspecific
pelvic inflammatory process. There is no evidence for abscess.

## 2023-01-19 ENCOUNTER — Ambulatory Visit: Payer: Medicaid Other | Admitting: Neurology

## 2023-03-31 ENCOUNTER — Ambulatory Visit: Payer: Medicaid Other | Admitting: Obstetrics & Gynecology

## 2023-09-17 ENCOUNTER — Ambulatory Visit: Payer: Medicaid Other | Admitting: Adult Health

## 2023-12-29 ENCOUNTER — Ambulatory Visit: Admitting: Adult Health

## 2024-02-16 ENCOUNTER — Encounter: Admitting: Adult Health

## 2024-02-16 ENCOUNTER — Ambulatory Visit: Admitting: Adult Health

## 2024-04-12 ENCOUNTER — Encounter: Admitting: Women's Health

## 2024-04-12 DIAGNOSIS — Z124 Encounter for screening for malignant neoplasm of cervix: Secondary | ICD-10-CM

## 2024-04-12 DIAGNOSIS — Z113 Encounter for screening for infections with a predominantly sexual mode of transmission: Secondary | ICD-10-CM

## 2024-06-15 ENCOUNTER — Encounter: Admitting: Advanced Practice Midwife
